# Patient Record
Sex: Male | Born: 1985 | Race: White | Hispanic: Yes | Smoking: Never smoker
Health system: Southern US, Community
[De-identification: ages and names within clinical notes are randomized; demographics above are authoritative.]

## PROBLEM LIST (undated history)

## (undated) DIAGNOSIS — Z789 Other specified health status: Secondary | ICD-10-CM

## (undated) HISTORY — PX: NO PAST SURGERIES: SHX2092

---

## 2007-04-13 ENCOUNTER — Encounter: Payer: Self-pay | Admitting: Emergency Medicine

## 2007-04-13 ENCOUNTER — Ambulatory Visit (HOSPITAL_COMMUNITY): Admission: RE | Admit: 2007-04-13 | Discharge: 2007-04-13 | Payer: Self-pay

## 2015-04-30 ENCOUNTER — Emergency Department (HOSPITAL_COMMUNITY)
Admission: EM | Admit: 2015-04-30 | Discharge: 2015-04-30 | Disposition: A | Discharge status: Home / Self Care | Payer: Self-pay | Attending: Emergency Medicine | Admitting: Emergency Medicine

## 2015-04-30 ENCOUNTER — Emergency Department (HOSPITAL_COMMUNITY): Admission: EM | Admit: 2015-04-30 | Discharge: 2015-04-30 | Discharge status: Absent without leave | Payer: Self-pay

## 2015-04-30 ENCOUNTER — Encounter (HOSPITAL_COMMUNITY): Payer: Self-pay | Admitting: *Deleted

## 2015-04-30 DIAGNOSIS — M5431 Sciatica, right side: Secondary | ICD-10-CM | POA: Insufficient documentation

## 2015-04-30 MED ORDER — DIAZEPAM 5 MG/ML IJ SOLN
5.0000 mg | Freq: Once | INTRAMUSCULAR | Status: AC
  Administered 2015-04-30: 5 mg via INTRAMUSCULAR
  Filled 2015-04-30: qty 2

## 2015-04-30 MED ORDER — PREDNISONE 20 MG PO TABS: 40.0000 mg | ORAL_TABLET | Freq: Every day | ORAL | Status: AC

## 2015-04-30 MED ORDER — KETOROLAC TROMETHAMINE 60 MG/2ML IM SOLN
60.0000 mg | Freq: Once | INTRAMUSCULAR | Status: AC
Start: 2015-04-30 — End: 2015-04-30
  Administered 2015-04-30: 60 mg via INTRAMUSCULAR
  Filled 2015-04-30: qty 2

## 2015-04-30 MED ORDER — HYDROCODONE-ACETAMINOPHEN 5-325 MG PO TABS: 2.0000 | ORAL_TABLET | ORAL | Status: DC | PRN

## 2015-04-30 MED ORDER — PREDNISONE 20 MG PO TABS
60.0000 mg | ORAL_TABLET | Freq: Once | ORAL | Status: AC
  Administered 2015-04-30: 60 mg via ORAL
  Filled 2015-04-30: qty 3

## 2015-04-30 MED ORDER — METHOCARBAMOL 500 MG PO TABS: 500.0000 mg | ORAL_TABLET | Freq: Two times a day (BID) | ORAL | Status: AC

## 2015-04-30 NOTE — ED Notes (Signed)
Pt states has been having lower back pain x1 month and been seen by a doctor and prescribed muscle relaxer. Pt states he bent over today and had sudden burning sensation that started in his right hip with radiation to right leg. Pt has no neuro deficits noted, pt able to bear weight on and ambulate. Pt denies any urinary or bladder incontinence. nad noted at this time.

## 2015-04-30 NOTE — Discharge Instructions (Signed)
Take prednisone as directed until gone. Take Vicodin as needed for pain. Take Robaxin as needed for muscle spasm. You may take all these medications together. Refer to attached documents for more information.

## 2015-04-30 NOTE — ED Provider Notes (Signed)
CSN: [id number]     Arrival date & time 04/30/15  1510 History  This chart was scribed for non-physician provider Emilia Beck, PA-C, working with Richardean Canal, MD by Phillis Haggis, ED Scribe. This patient was seen in room TR05C/TR05C and patient care was started at 4:22 PM.    Chief Complaint  Patient presents with  . Back Pain   Patient is a 29 y.o. male presenting with back pain. The history is provided by the spouse. The history is limited by a language barrier. No language interpreter was used.  Back Pain  HPI Comments: John Moss is a 29 y.o. male who presents to the Emergency Department complaining of lower back pain onset one month ago. Wife states that the patient bent over and felt sudden burning pain that radiated from his hip to his leg. Wife states that the patient says that the pain has improved slightly since arrival. Wife states that the patient says the pain starts from his butt and radiates down. She states that he was prescribed muscle relaxers Tuesday and that it does not help.   History reviewed. No pertinent past medical history. No past surgical history on file. History reviewed. No pertinent family history. History  Substance Use Topics  . Smoking status: Not on file  . Smokeless tobacco: Not on file  . Alcohol Use: Not on file    Review of Systems  Musculoskeletal: Positive for back pain and arthralgias.  All other systems reviewed and are negative.  Allergies  Review of patient's allergies indicates no known allergies.  Home Medications   Prior to Admission medications   Not on File   BP 112/64 mmHg  Pulse 69  Temp(Src) 97.7 F (36.5 C) (Oral)  Resp 20  Wt 157 lb (71.215 kg)  SpO2 96%   Physical Exam  Constitutional: He is oriented to person, place, and time. He appears well-developed and well-nourished. No distress.  HENT:  Head: Normocephalic and atraumatic.  Eyes: Conjunctivae and EOM are normal.  Neck: Normal range of motion.  Neck supple.  Cardiovascular: Normal rate, regular rhythm and normal heart sounds.   Pulmonary/Chest: Effort normal and breath sounds normal.  Abdominal: Soft. He exhibits no distension. There is no tenderness. There is no rebound.  Musculoskeletal: Normal range of motion. He exhibits no edema.  No midline spine tenderness to palpation. Right gluteal tenderness to palpation.   Neurological: He is alert and oriented to person, place, and time.  Extremity strength and sensation intact and equal bilaterally.  Skin: Skin is warm and dry.  Psychiatric: He has a normal mood and affect. His behavior is normal.  Nursing note and vitals reviewed.   ED Course  Procedures (including critical care time) DIAGNOSTIC STUDIES: Oxygen Saturation is 96% on room air, normal by my interpretation.    COORDINATION OF CARE: 4:23 PM-Discussed treatment plan which includes muscle relaxers, steroids, valium and toradol with pt at bedside and pt agreed to plan.   Labs Review Labs Reviewed - No data to display  Imaging Review No results found.   EKG Interpretation None      MDM   Final diagnoses:  Sciatica neuralgia, right    5:14 PM Patient has sciatica neuralgia on the right. No bladder/bowel incontinence or saddle paresthesias. Vitals stable and patient afebrile. Patient reports improvement after toradol and prednisone. Patient will have Vicodin, prednisone, and vicodin for pain.   I personally performed the services described in this documentation, which was scribed in my presence. The  recorded information has been reviewed and is accurate.    Emilia BeckKaitlyn Maha Fischel, PA-C 04/30/15 1715  Richardean Canalavid H Yao, MD 04/30/15 2329

## 2015-04-30 NOTE — ED Notes (Signed)
Pt reports lower back pain for a month. Pt bent over to pick something up then pt had a sudden onset of sharp lower back pain radiating down right leg

## 2015-05-05 ENCOUNTER — Emergency Department (HOSPITAL_COMMUNITY)
Admission: EM | Admit: 2015-05-05 | Discharge: 2015-05-05 | Disposition: A | Discharge status: Home / Self Care | Payer: Self-pay | Attending: Emergency Medicine | Admitting: Emergency Medicine

## 2015-05-05 ENCOUNTER — Encounter (HOSPITAL_COMMUNITY): Payer: Self-pay | Admitting: Emergency Medicine

## 2015-05-05 DIAGNOSIS — Z79899 Other long term (current) drug therapy: Secondary | ICD-10-CM | POA: Insufficient documentation

## 2015-05-05 DIAGNOSIS — Z792 Long term (current) use of antibiotics: Secondary | ICD-10-CM | POA: Insufficient documentation

## 2015-05-05 DIAGNOSIS — M5431 Sciatica, right side: Secondary | ICD-10-CM | POA: Insufficient documentation

## 2015-05-05 DIAGNOSIS — R200 Anesthesia of skin: Secondary | ICD-10-CM | POA: Insufficient documentation

## 2015-05-05 MED ORDER — HYDROMORPHONE HCL 1 MG/ML IJ SOLN
1.0000 mg | Freq: Once | INTRAMUSCULAR | Status: AC
  Administered 2015-05-05: 1 mg via INTRAMUSCULAR
  Filled 2015-05-05: qty 1

## 2015-05-05 MED ORDER — HYDROCODONE-ACETAMINOPHEN 5-325 MG PO TABS: ORAL_TABLET | ORAL | Status: AC

## 2015-05-05 MED ORDER — NAPROXEN 500 MG PO TABS: 500.0000 mg | ORAL_TABLET | Freq: Two times a day (BID) | ORAL | Status: AC

## 2015-05-05 MED ORDER — KETOROLAC TROMETHAMINE 60 MG/2ML IM SOLN
60.0000 mg | Freq: Once | INTRAMUSCULAR | Status: AC
  Administered 2015-05-05: 60 mg via INTRAMUSCULAR
  Filled 2015-05-05: qty 2

## 2015-05-05 NOTE — Discharge Instructions (Signed)
Please read and follow all provided instructions.  Your diagnoses today include:  1. Sciatica, right    Tests performed today include:  Vital signs - see below for your results today  Medications prescribed:   Vicodin (hydrocodone/acetaminophen) - narcotic pain medication  DO NOT drive or perform any activities that require you to be awake and alert because this medicine can make you drowsy. BE VERY CAREFUL not to take multiple medicines containing Tylenol (also called acetaminophen). Doing so can lead to an overdose which can damage your liver and cause liver failure and possibly death.   Naproxen - anti-inflammatory pain medication  Do not exceed 500mg  naproxen every 12 hours, take with food  You have been prescribed an anti-inflammatory medication or NSAID. Take with food. Take smallest effective dose for the shortest duration needed for your pain. Stop taking if you experience stomach pain or vomiting.   Take any prescribed medications only as directed.  Home care instructions:   Follow any educational materials contained in this packet  Please rest, use ice or heat on your back for the next several days  Do not lift, push, pull anything more than 10 pounds for the next week  Follow-up instructions: Please follow-up with your primary care provider in the next 3 days.   Return instructions:  SEEK IMMEDIATE MEDICAL ATTENTION IF YOU HAVE:  New numbness, tingling, weakness, or problem with the use of your arms or legs  Severe back pain not relieved with medications  Loss control of your bowels or bladder  Increasing pain in any areas of the body (such as chest or abdominal pain)  Shortness of breath, dizziness, or fainting.   Worsening nausea (feeling sick to your stomach), vomiting, fever, or sweats  Any other emergent concerns regarding your health   Additional Information:  Your vital signs today were: BP 114/71 mmHg   Pulse 70   Temp(Src) 97.7 F (36.5 C)  (Oral)   Resp 18   SpO2 100% If your blood pressure (BP) was elevated above 135/85 this visit, please have this repeated by your doctor within one month. --------------

## 2015-05-05 NOTE — ED Provider Notes (Signed)
CSN: [id number]     Arrival date & time 05/05/15  1204 History  This chart was scribed for Rhea BleacherJosh [place], PA-C working with Benjiman CoreNathan Pickering, MD by Elveria Risingimelie Horne, ED Scribe. This patient was seen in room TR11C/TR11C and the patient's care was started at 12:53 PM.   Chief Complaint  Patient presents with  . Back Pain  . Leg Pain   The history is provided by the patient. Language interpreter used: Friend accompanies patient to interpret.    HPI Comments: John Moss is a 29 y.o. male who presents to the Emergency Department complaining of continued lower back pain with radiation in to his right buttocks, extending down length of right leg, posteriorly. Patient evaluated 5/1 for the same symptoms; complaint of lower back pain with radiation into leg ongoing for one month. Patient diagnosed with right sciatica neuralgia and discharged with Vicodin, Robaxin and Prednisone. Patient reports mild improvement since his initial visit with treatment, but reports return of worsening pain this morning. Patient reports pain with movement, position, and applied pressure; patient is limping preferring not to bear weight on the leg. Patient reports numbness in his leg. Patient works in [employer]. Patient does not have primary care doctor. Patient denies warning symptoms of back pain including: fecal incontinence, urinary retention or overflow incontinence, night sweats, waking from sleep with back pain, unexplained fevers or weight loss, h/o cancer, IVDU, recent trauma.     History reviewed. No pertinent past medical history. History reviewed. No pertinent past surgical history. History reviewed. No pertinent family history. History  Substance Use Topics  . Smoking status: Not on file  . Smokeless tobacco: Not on file  . Alcohol Use: Not on file    Review of Systems  Constitutional: Negative for fever and unexpected weight change.  Gastrointestinal: Negative for nausea, vomiting, abdominal pain,  diarrhea and constipation.       Neg for fecal incontinence  Genitourinary: Negative for dysuria, hematuria, flank pain and difficulty urinating.       Negative for urinary incontinence or retention  Musculoskeletal: Positive for back pain.  Skin: Negative for rash.  Neurological: Positive for numbness. Negative for weakness.       Negative for saddle paresthesias       Allergies  Review of patient's allergies indicates no known allergies.  Home Medications   Prior to Admission medications   Medication Sig Start Date End Date Taking? Authorizing Provider  HYDROcodone-acetaminophen (NORCO/VICODIN) 5-325 MG per tablet Take 2 tablets by mouth every 4 (four) hours as needed. 04/30/15   Kaitlyn Szekalski, PA-C  methocarbamol (ROBAXIN) 500 MG tablet Take 1 tablet (500 mg total) by mouth 2 (two) times daily. 04/30/15   Kaitlyn Szekalski, PA-C  predniSONE (DELTASONE) 20 MG tablet Take 2 tablets (40 mg total) by mouth daily. Take 40 mg by mouth daily for 3 days, then 20mg  by mouth daily for 3 days, then 10mg  daily for 3 days 04/30/15   Emilia BeckKaitlyn Szekalski, PA-C   Triage Vitals: BP 114/71 mmHg  Pulse 70  Temp(Src) 97.7 F (36.5 C) (Oral)  Resp 18  SpO2 100% Physical Exam  Constitutional: He is oriented to person, place, and time. He appears well-developed and well-nourished. He appears distressed (in pain).  HENT:  Head: Normocephalic and atraumatic.  Eyes: Conjunctivae and EOM are normal.  Neck: Normal range of motion. Neck supple. No tracheal deviation present.  Cardiovascular: Normal rate.   Pulmonary/Chest: Effort normal. No respiratory distress.  Abdominal: Soft. There is no tenderness. There is  no CVA tenderness.  Musculoskeletal: Normal range of motion. He exhibits no tenderness.  No step-off noted with palpation of spine.   Neurological: He is alert and oriented to person, place, and time. He has normal strength and normal reflexes. A sensory deficit (posterior R thigh.) is present. He  exhibits normal muscle tone.  5/5 strength in entire lower extremities bilaterally. Patient ambulatory with slight limping but no foot drop.   Skin: Skin is warm and dry.  Psychiatric: He has a normal mood and affect. His behavior is normal.  Nursing note and vitals reviewed.   ED Course  Procedures (including critical care time)  COORDINATION OF CARE: 1:02 PM- Discussed treatment plan with patient at bedside and patient agreed to plan.   Labs Review Labs Reviewed - No data to display  Imaging Review No results found.   EKG Interpretation None      Patient seen and examined. Medications ordered. IM dilaudid for pain. Discussed need for close PCP follow-up and evaluation. Referral given.  Vital signs reviewed and are as follows: Filed Vitals:   05/05/15 1331  BP: 119/67  Pulse: 66  Temp: 98.1 F (36.7 C)  Resp: 16   No red flag s/s of low back pain. Patient was counseled on back pain precautions and told to do activity as tolerated but do not lift, push, or pull heavy objects more than 10 pounds for the next week.  Patient counseled to use ice or heat on back for no longer than 15 minutes every hour.   Patient prescribed narcotic pain medicine and counseled on proper use of narcotic pain medications. Counseled not to combine this medication with others containing tylenol.   Urged patient not to drink alcohol, drive, or perform any other activities that requires focus while taking either of these medications.  Patient urged to follow-up with PCP if pain does not improve with treatment and rest or if pain becomes recurrent. Urged to return with worsening severe pain, loss of bowel or bladder control, trouble walking.   The patient verbalizes understanding and agrees with the plan.     MDM   Final diagnoses:  Sciatica, right   Patient with back pain with radicular features. Patient is ambulatory. No warning symptoms of back pain including: fecal incontinence, urinary  retention or overflow incontinence, night sweats, waking from sleep with back pain, unexplained fevers or weight loss, h/o cancer, IVDU, recent trauma. No concern for cauda equina, epidural abscess, or other serious cause of back pain. Conservative measures such as rest, ice/heat and pain medicine indicated with PCP follow-up if no improvement with conservative management.    I personally performed the services described in this documentation, which was scribed in my presence. The recorded information has been reviewed and is accurate.    Renne CriglerJoshua Irasema Chalk, PA-C 05/05/15 1439  Benjiman CoreNathan Pickering, MD 05/05/15 [phone]

## 2015-05-05 NOTE — ED Notes (Signed)
States right leg feels "asleep"== it doesn't do what I want it to"

## 2015-05-05 NOTE — ED Notes (Signed)
Pt sts lower back pain with radiation to right leg; seen here for same on Sunday

## 2015-05-31 ENCOUNTER — Ambulatory Visit: Payer: Self-pay | Attending: Internal Medicine

## 2015-08-25 ENCOUNTER — Emergency Department (INDEPENDENT_AMBULATORY_CARE_PROVIDER_SITE_OTHER)
Admission: EM | Admit: 2015-08-25 | Discharge: 2015-08-25 | Disposition: A | Discharge status: Home / Self Care | Payer: Self-pay | Source: Home / Self Care | Attending: Family Medicine | Admitting: Family Medicine

## 2015-08-25 ENCOUNTER — Encounter (HOSPITAL_COMMUNITY): Payer: Self-pay | Admitting: Emergency Medicine

## 2015-08-25 DIAGNOSIS — M5416 Radiculopathy, lumbar region: Secondary | ICD-10-CM

## 2015-08-25 MED ORDER — TRAMADOL HCL 50 MG PO TABS: 50.0000 mg | ORAL_TABLET | Freq: Two times a day (BID) | ORAL | Status: DC | PRN

## 2015-08-25 MED ORDER — PREDNISONE 10 MG (48) PO TBPK: ORAL_TABLET | Freq: Every day | ORAL | Status: DC

## 2015-08-25 NOTE — ED Notes (Signed)
C/o right leg pain onset May... Last 3 days have been getting worse Denies inj/trauma Sx include numbness and tingly Saw a chiropractor yest... Has also been to [facility] ER back in may for similar sx Steady gait... No acute distress.

## 2015-08-25 NOTE — ED Provider Notes (Signed)
CSN: [id number]     Arrival date & time 08/25/15  1307 History   First MD Initiated Contact with Patient 08/25/15 1359     Chief Complaint  Patient presents with  . Leg Pain   (Consider location/radiation/quality/duration/timing/severity/associated sxs/prior Treatment) HPI Comments: Patient presents with right leg pain and periodic tingling that began in May. No injury.  He carries a history of "pinched nerve" in his lower back. He has been going to a chiropractor and this has helped. In the last few days the pain has worsened. He denies weakness. No loss of bowel of bladder control is noted.    Patient is a 29 y.o. male presenting with leg pain. The history is provided by the patient. The history is limited by a language barrier. A language interpreter was used.  Leg Pain   History reviewed. No pertinent past medical history. History reviewed. No pertinent past surgical history. No family history on file. Social History  Substance Use Topics  . Smoking status: Never Smoker   . Smokeless tobacco: None  . Alcohol Use: No    Review of Systems  All other systems reviewed and are negative.   Allergies  Review of patient's allergies indicates no known allergies.  Home Medications   Prior to Admission medications   Medication Sig Start Date End Date Taking? Authorizing Provider  predniSONE (STERAPRED UNI-PAK 48 TAB) 10 MG (48) TBPK tablet Take by mouth daily. 08/25/15   Riki Sheer, PA-C  traMADol (ULTRAM) 50 MG tablet Take 1 tablet (50 mg total) by mouth every 12 (twelve) hours as needed for moderate pain. 08/25/15   Riki Sheer, PA-C   Meds Ordered and Administered this Visit  Medications - No data to display  BP 116/73 mmHg  Pulse 53  Temp(Src) 97.8 F (36.6 C) (Oral)  Resp 16  SpO2 100% No data found.   Physical Exam  Constitutional: He is oriented to person, place, and time. He appears well-developed and well-nourished. No distress.  Pulmonary/Chest: Effort  normal.  Musculoskeletal: He exhibits no edema or tenderness.  Limited ROM of the lumbar spine; worsened pain with extension. Positive SLR on the right  Neurological: He is alert and oriented to person, place, and time. He displays normal reflexes. He exhibits normal muscle tone. Coordination normal.  Skin: Skin is warm and dry. He is not diaphoretic.  Psychiatric: His behavior is normal.  Nursing note and vitals reviewed.   ED Course  Procedures (including critical care time)  Labs Review Labs Reviewed - No data to display  Imaging Review No results found.   Visual Acuity Review  Right Eye Distance:   Left Eye Distance:   Bilateral Distance:    Right Eye Near:   Left Eye Near:    Bilateral Near:         MDM   1. Pain, radicular, lumbar    Agree with radicular leg pain without neuro deficit at this time. Ultimately he needs an orthopedic evaluation and MRI given the extended duration of pain. He is given info for an appt. In the interim treat with a Prednisone pack and prn tramadol. Should he develop weakness, loss of bowel of bladder control present to ER emergently.     Riki Sheer, PA-C 08/25/15 1438

## 2015-08-25 NOTE — Discharge Instructions (Signed)
Radiculopatía lumbosacra °(Lumbosacral Radiculopathy) °Le han diagnosticado una radiculopatía lumbosacra. Significa que un nervio está comprimido en la zona inferior de la espalda (área lumbosacra). Cuando esto ocurre, puede sentir debilidad en las piernas y ser incapaz de mantenerse parado en puntas de pie. Puede sentir un dolor que le corre por las piernas hacia abajo. Puede tener dificultad para caminar normalmente. Hay numerosas causas que originan este problema. Algunas veces es consecuencia de una lesión o simplemente es debido a artritis o problemas óseos. Otra de las causas puede ser una enfermedad, como la diabetes. Si no mejora luego del tratamiento, deberá realizarse estudios más profundos para hallar la causa exacta. °DIAGNÓSTICO °Puede ser necesario que le tomen radiografías si los problemas persisten durante mucho tiempo. Podrán realizarle una electromiografía. En este examen se estudia el funcionamiento de los nervios y los músculos. °INSTRUCCIONES PARA EL CUIDADO DOMICILIARIO °· Puede aliviarlo la aplicación de una bolsa con hielo. Coloque el hielo en una bolsa plástica y envuélvala en una toalla para prevenir el congelamiento de la piel. Podrá aplicarlo cada 2 horas durante 20 ó 30 minutos, o cuando lo crea necesario, mientras se encuentre despierto, o según se lo haya indicado el profesional que lo asiste. °· Si luego de aplicar calor y frío observa que uno le hace mejor que el otro, continúe con el que le trae mayor alivio. °· Utilice los medicamentos de venta libre o de prescripción para el dolor, el malestar o la fiebre, según se lo indique el profesional que lo asiste. °· Si le indican fisioterapia, siga las indicaciones de su médico. °SOLICITE ATENCIÓN MÉDICA DE INMEDIATO SI: °· El dolor no se alivia con los medicamentos. °· Parece empeorar más que mejorar. °· Aumenta la debilidad en las piernas. °· Pierde el control de la vejiga o del intestino. °· Tiene dificultad para caminar o para  mantener el equilibrio o torpeza en el uso de las piernas. °· Tiene fiebre. °ESTÉ SEGURO QUE:  °· Comprende las instrucciones para el alta médica. °· Controlará su enfermedad. °· Solicitará atención médica de inmediato según las indicaciones. °Document Released: 09/25/2005 Document Revised: 03/09/2012 °ExitCare® Patient Information ©2015 [employer]. This information is not intended to replace advice given to you by your health care provider. Make sure you discuss any questions you have with your health care provider. ° °

## 2015-08-30 ENCOUNTER — Encounter: Payer: Self-pay | Admitting: Internal Medicine

## 2015-08-30 ENCOUNTER — Ambulatory Visit: Payer: Self-pay | Attending: Internal Medicine | Admitting: Internal Medicine

## 2015-08-30 ENCOUNTER — Telehealth: Payer: Self-pay | Admitting: Internal Medicine

## 2015-08-30 VITALS — BP 156/77 | HR 73 | Temp 98.0°F | Resp 16 | Wt 162.4 lb

## 2015-08-30 DIAGNOSIS — M79604 Pain in right leg: Secondary | ICD-10-CM | POA: Insufficient documentation

## 2015-08-30 DIAGNOSIS — M5410 Radiculopathy, site unspecified: Secondary | ICD-10-CM

## 2015-08-30 DIAGNOSIS — M5431 Sciatica, right side: Secondary | ICD-10-CM | POA: Insufficient documentation

## 2015-08-30 DIAGNOSIS — Z79899 Other long term (current) drug therapy: Secondary | ICD-10-CM | POA: Insufficient documentation

## 2015-08-30 DIAGNOSIS — Z0000 Encounter for general adult medical examination without abnormal findings: Secondary | ICD-10-CM

## 2015-08-30 MED ORDER — TRAMADOL HCL 50 MG PO TABS: 50.0000 mg | ORAL_TABLET | Freq: Two times a day (BID) | ORAL | Status: DC | PRN

## 2015-08-30 NOTE — Progress Notes (Signed)
Patient ID: John Moss, male   DOB: 02/03/1986, 29 y.o.   MRN: [id number]  WGN:[id number]  HQI:[id number]  DOB - [birth year]  CC:  Chief Complaint  Patient presents with  . New Patient (Initial Visit)       HPI: John Moss is a 29 y.o. male here today to establish medical care. Patient has no past medical history. He reports that he went to the Urgent Care on 8/26 for right leg pain. He reports that he has had a lumbar spine xray that was negative but I cannot find any records of that in the EMR.  He was told that the pain is radicular and was given Tramadol and prednisone all of which he is currently taking with mild relief. He states that he has been told in the past that he has a pinched nerve ("3 disc pressing on nerve") in his lower back and has been going to see a chiropractor who is currently doing exercises with him. Last chiropractor visit was last week.  He also reports that he went to another clinic Friday that sent him to [facility] who states that his disc were fine and they thought the pain was musculoskeletal and gave him Robaxin to take. Patient reports that pain is located in his right buttock and radiates down his posterior right leg. The pain is sharp and shooting. He denies bowel or bladder dysfunction.  No Known Allergies History reviewed. No pertinent past medical history. Current Outpatient Prescriptions on File Prior to Visit  Medication Sig Dispense Refill  . predniSONE (STERAPRED UNI-PAK 48 TAB) 10 MG (48) TBPK tablet Take by mouth daily. 48 tablet 0  . traMADol (ULTRAM) 50 MG tablet Take 1 tablet (50 mg total) by mouth every 12 (twelve) hours as needed for moderate pain. 15 tablet 0   No current facility-administered medications on file prior to visit.   History reviewed. No pertinent family history. Social History   Social History  . Marital Status: Married    Spouse Name: N/A  . Number of Children: N/A  . Years of Education: N/A    Occupational History  . Not on file.   Social History Main Topics  . Smoking status: Never Smoker   . Smokeless tobacco: Not on file  . Alcohol Use: No  . Drug Use: No  . Sexual Activity: Not on file   Other Topics Concern  . Not on file   Social History Narrative    Review of Systems: Other than what is stated in HPI, all other systems are negative.   Objective:   Filed Vitals:   08/30/15 1118  BP: 156/77  Pulse: 73  Temp: 98 F (36.7 C)  Resp: 16    Physical Exam  Constitutional: He is oriented to person, place, and time.  Cardiovascular: Normal rate, regular rhythm and normal heart sounds.   Pulmonary/Chest: Effort normal and breath sounds normal.  Musculoskeletal: He exhibits no tenderness.  Positive right straight leg raise   Neurological: He is alert and oriented to person, place, and time. He has normal reflexes.  Psychiatric: He has a normal mood and affect.    No results found for: WBC, HGB, HCT, MCV, PLT No results found for: CREATININE, BUN, NA, K, CL, CO2  No results found for: HGBA1C Lipid Panel  No results found for: CHOL, TRIG, HDL, CHOLHDL, VLDL, LDLCALC     Assessment and plan:   John Moss was seen today for new patient (initial visit).  Diagnoses and all orders  for this visit:  Radicular leg pain -     Ambulatory referral to Orthopedic Surgery -     Refill traMADol (ULTRAM) 50 MG tablet; Take 1 tablet (50 mg total) by mouth every 12 (twelve) hours as needed for moderate pain. I have explained to patient to continue all current medications and if no improvement in 2 weeks he can call and reschedule appointment with [facility].   Sciatica, right See above Explained signs and symptoms that should warrant immediate attention.  Patient verbalized understanding with teach back used.   Interpreter was used to communicate directly with patient for the entire encounter including providing detailed patient instructions.   Return  if symptoms worsen or fail to improve.   Ambrose Finland, NP-C [facility] [phone] 08/30/2015, 11:23 AM

## 2015-08-30 NOTE — Telephone Encounter (Signed)
Patient forgot to mention that he would like a dental referral, please f/u

## 2015-08-30 NOTE — Progress Notes (Signed)
Patient complains of having pain to the lower right Side of his buttocks Patient states he was seen in urgent care on Monday and x ray was negative Patient currently taking prednisone tramadol and methocarbanol Interpreter line was used [employer] [id number]

## 2015-09-12 ENCOUNTER — Other Ambulatory Visit (HOSPITAL_COMMUNITY): Payer: Self-pay | Admitting: Physician Assistant

## 2015-09-12 DIAGNOSIS — M545 Low back pain: Secondary | ICD-10-CM

## 2015-09-18 ENCOUNTER — Emergency Department (INDEPENDENT_AMBULATORY_CARE_PROVIDER_SITE_OTHER)
Admission: EM | Admit: 2015-09-18 | Discharge: 2015-09-18 | Disposition: A | Discharge status: Nursing Home (Includes ICF) | Payer: Self-pay | Source: Home / Self Care | Attending: Family Medicine | Admitting: Family Medicine

## 2015-09-18 ENCOUNTER — Emergency Department (HOSPITAL_COMMUNITY)
Admission: EM | Admit: 2015-09-18 | Discharge: 2015-09-19 | Disposition: A | Discharge status: Home / Self Care | Payer: Self-pay | Attending: Emergency Medicine | Admitting: Emergency Medicine

## 2015-09-18 ENCOUNTER — Emergency Department (HOSPITAL_COMMUNITY): Payer: Self-pay

## 2015-09-18 ENCOUNTER — Encounter (HOSPITAL_COMMUNITY): Payer: Self-pay | Admitting: Emergency Medicine

## 2015-09-18 ENCOUNTER — Encounter (HOSPITAL_COMMUNITY): Payer: Self-pay

## 2015-09-18 DIAGNOSIS — R109 Unspecified abdominal pain: Secondary | ICD-10-CM | POA: Insufficient documentation

## 2015-09-18 DIAGNOSIS — M5441 Lumbago with sciatica, right side: Secondary | ICD-10-CM | POA: Insufficient documentation

## 2015-09-18 DIAGNOSIS — Z7982 Long term (current) use of aspirin: Secondary | ICD-10-CM | POA: Insufficient documentation

## 2015-09-18 LAB — CBC WITH DIFFERENTIAL/PLATELET
BASOS ABS: 0 10*3/uL (ref 0.0–0.1)
Basophils Relative: 0 %
EOS ABS: 0 10*3/uL (ref 0.0–0.7)
EOS PCT: 0 %
HCT: 46.1 % (ref 39.0–52.0)
Hemoglobin: 16.1 g/dL (ref 13.0–17.0)
LYMPHS PCT: 6 %
Lymphs Abs: 0.7 10*3/uL (ref 0.7–4.0)
MCH: 28.8 pg (ref 26.0–34.0)
MCHC: 34.9 g/dL (ref 30.0–36.0)
MCV: 82.3 fL (ref 78.0–100.0)
MONO ABS: 0.1 10*3/uL (ref 0.1–1.0)
Monocytes Relative: 1 %
Neutro Abs: 10.8 10*3/uL — ABNORMAL HIGH (ref 1.7–7.7)
Neutrophils Relative %: 93 %
PLATELETS: 330 10*3/uL (ref 150–400)
RBC: 5.6 MIL/uL (ref 4.22–5.81)
RDW: 12.8 % (ref 11.5–15.5)
WBC: 11.6 10*3/uL — AB (ref 4.0–10.5)

## 2015-09-18 LAB — BASIC METABOLIC PANEL
ANION GAP: 12 (ref 5–15)
BUN: 7 mg/dL (ref 6–20)
CALCIUM: 9.6 mg/dL (ref 8.9–10.3)
CO2: 24 mmol/L (ref 22–32)
CREATININE: 0.66 mg/dL (ref 0.61–1.24)
Chloride: 102 mmol/L (ref 101–111)
GFR calc Af Amer: >60 mL/min (ref >60)
GLUCOSE: 111 mg/dL — AB (ref 65–99)
Potassium: 3.9 mmol/L (ref 3.5–5.1)
Sodium: 138 mmol/L (ref 135–145)

## 2015-09-18 LAB — URINALYSIS, ROUTINE W REFLEX MICROSCOPIC
BILIRUBIN URINE: NEGATIVE
GLUCOSE, UA: NEGATIVE mg/dL
Hgb urine dipstick: NEGATIVE
KETONES UR: 15 mg/dL — AB
LEUKOCYTES UA: NEGATIVE
NITRITE: NEGATIVE
PH: 6 (ref 5.0–8.0)
PROTEIN: NEGATIVE mg/dL
Specific Gravity, Urine: 1.011 (ref 1.005–1.030)
Urobilinogen, UA: 0.2 mg/dL (ref 0.0–1.0)

## 2015-09-18 MED ORDER — IBUPROFEN 800 MG PO TABS
800.0000 mg | ORAL_TABLET | Freq: Once | ORAL | Status: AC
  Administered 2015-09-18: 800 mg via ORAL
  Filled 2015-09-18: qty 1

## 2015-09-18 MED ORDER — METHYLPREDNISOLONE SODIUM SUCC 125 MG IJ SOLR
125.0000 mg | Freq: Once | INTRAMUSCULAR | Status: AC
  Administered 2015-09-18: 125 mg via INTRAMUSCULAR

## 2015-09-18 MED ORDER — ONDANSETRON 4 MG PO TBDP
8.0000 mg | ORAL_TABLET | Freq: Once | ORAL | Status: AC
  Administered 2015-09-18: 8 mg via ORAL

## 2015-09-18 MED ORDER — METHYLPREDNISOLONE SODIUM SUCC 125 MG IJ SOLR
INTRAMUSCULAR | Status: AC
  Filled 2015-09-18: qty 2

## 2015-09-18 MED ORDER — HYDROMORPHONE HCL 1 MG/ML IJ SOLN
1.0000 mg | Freq: Once | INTRAMUSCULAR | Status: AC
  Administered 2015-09-18: 1 mg via INTRAVENOUS
  Filled 2015-09-18: qty 1

## 2015-09-18 MED ORDER — HYDROMORPHONE HCL 1 MG/ML IJ SOLN
INTRAMUSCULAR | Status: AC
  Filled 2015-09-18: qty 1

## 2015-09-18 MED ORDER — IOHEXOL 300 MG/ML  SOLN
25.0000 mL | Freq: Once | INTRAMUSCULAR | Status: AC | PRN
  Administered 2015-09-18: 25 mL via ORAL

## 2015-09-18 MED ORDER — PREDNISONE 20 MG PO TABS
60.0000 mg | ORAL_TABLET | Freq: Once | ORAL | Status: AC
  Administered 2015-09-18: 60 mg via ORAL
  Filled 2015-09-18: qty 3

## 2015-09-18 MED ORDER — IOHEXOL 300 MG/ML  SOLN
100.0000 mL | Freq: Once | INTRAMUSCULAR | Status: AC | PRN
  Administered 2015-09-18: 100 mL via INTRAVENOUS

## 2015-09-18 MED ORDER — HYDROMORPHONE HCL 1 MG/ML IJ SOLN
1.0000 mg | Freq: Once | INTRAMUSCULAR | Status: AC
  Administered 2015-09-18: 1 mg via INTRAMUSCULAR

## 2015-09-18 MED ORDER — ONDANSETRON 4 MG PO TBDP
ORAL_TABLET | ORAL | Status: AC
  Filled 2015-09-18: qty 2

## 2015-09-18 NOTE — ED Notes (Signed)
Ct called as patient has finished drinking contrast.

## 2015-09-18 NOTE — ED Notes (Signed)
Assisted patient to the restroom, displayed poor ambulation and required assistance. Returned to room via whleelchair, called Dr. Alycia Rossetti to discuss ambulation and to request pain medication. MD acknowledges, awaiting new orders.

## 2015-09-18 NOTE — ED Notes (Signed)
Patient is currenlty consuming contrast.

## 2015-09-18 NOTE — ED Notes (Signed)
Left contact number with visitor while they step out.

## 2015-09-18 NOTE — ED Notes (Signed)
Back pain, particularly right buttocks and down leg.  Difficulty urinating.  No known injury.  Onset in may, and since onset has been seen at [facility], [facility], chiropractor, [facility], and by a specialist.  Specialist has scheduled patient for mri on Wednesday, but unable to wait until then.  Pain is unbearable

## 2015-09-18 NOTE — ED Provider Notes (Signed)
CSN: [id number]     Arrival date & time 09/18/15  1747 History   First MD Initiated Contact with Patient 09/18/15 1753     Chief Complaint  Patient presents with  . Back Pain   Patient is a 29 y.o. male presenting with general illness. The history is provided by the patient. The history is limited by a language barrier. No language interpreter was used.  Illness Location:  Back, right leg Quality:  Pain Severity:  Moderate Onset quality:  Gradual Timing:  Intermittent Progression:  Waxing and waning Chronicity: Acute on chronic. Context:  PMHx presenting with lower back pain. Onset 5 months ago. No known inciting injury or event. Persistent with intermittent worsening. Previous treatments including IV corticosteroid injections, muscle relaxants, tramadol, and aspirin with intermittent relief.  Associated symptoms: no abdominal pain, no chest pain, no cough, no diarrhea, no fever, no headaches, no nausea, no shortness of breath and no vomiting     History reviewed. No pertinent past medical history. History reviewed. No pertinent past surgical history. No family history on file. Social History  Substance Use Topics  . Smoking status: Never Smoker   . Smokeless tobacco: None  . Alcohol Use: No    Review of Systems  Constitutional: Negative for fever and chills.  Respiratory: Negative for cough and shortness of breath.   Cardiovascular: Negative for chest pain.  Gastrointestinal: Negative for nausea, vomiting, abdominal pain and diarrhea.  Musculoskeletal: Positive for back pain and arthralgias. Negative for joint swelling.  Neurological: Negative for dizziness, tremors, seizures, syncope, facial asymmetry, speech difficulty, weakness, light-headedness, numbness and headaches.  All other systems reviewed and are negative.   Allergies  Review of patient's allergies indicates no known allergies.  Home Medications   Prior to Admission medications   Medication Sig Start Date End  Date Taking? Authorizing Provider  aspirin EC 325 MG tablet Take 2,600 mg by mouth once.   Yes Historical Provider, MD  cyclobenzaprine (FLEXERIL) 10 MG tablet Take 1 tablet (10 mg total) by mouth daily as needed for muscle spasms. 09/19/15   Angelina Ok, MD  ibuprofen (ADVIL,MOTRIN) 800 MG tablet Take 1 tablet (800 mg total) by mouth every 8 (eight) hours as needed. 09/19/15   Angelina Ok, MD  predniSONE (DELTASONE) 20 MG tablet Take 2 tablets (40 mg total) by mouth once. 09/19/15   Angelina Ok, MD   BP 105/61 mmHg  Pulse 58  Temp(Src) 98.3 F (36.8 C) (Oral)  Resp 16  SpO2 96%   Physical Exam  Constitutional: He appears well-developed and well-nourished. No distress.  HENT:  Head: Normocephalic and atraumatic.  Eyes: Conjunctivae are normal. Pupils are equal, round, and reactive to light.  Neck: Normal range of motion. Neck supple.  Cardiovascular: Normal rate, regular rhythm and intact distal pulses.   Pulmonary/Chest: Effort normal and breath sounds normal. No respiratory distress. He has no wheezes.  Abdominal: Soft. Bowel sounds are normal. He exhibits no distension. There is no rebound and no guarding.  Abdomen with mild tenderness to palpation of inguinal crease without any obvious hernia.  Musculoskeletal: Normal range of motion. He exhibits tenderness. He exhibits no edema.  No midline tenderness to C/T/L-spine. There is palpation of right lumbar paraspinal muscles as well as right gluteal muscles. Positive straight leg raise test on right.  Neurological: He is alert. He has normal reflexes. He displays normal reflexes. No cranial nerve deficit. He exhibits normal muscle tone. Coordination normal.  Skin: Skin is warm and dry. He is  not diaphoretic.    ED Course  Procedures   Labs Review Labs Reviewed  CBC WITH DIFFERENTIAL/PLATELET - Abnormal; Notable for the following:    WBC 11.6 (*)    Neutro Abs 10.8 (*)    All other components within normal limits   URINALYSIS, ROUTINE W REFLEX MICROSCOPIC (NOT AT [facility]) - Abnormal; Notable for the following:    Ketones, ur 15 (*)    All other components within normal limits  BASIC METABOLIC PANEL - Abnormal; Notable for the following:    Glucose, Bld 111 (*)    All other components within normal limits   Imaging Review Ct Abdomen Pelvis W Contrast  09/19/2015   CLINICAL DATA:  Back and RIGHT buttocks pain extending down RIGHT leg, no known injury, RIGHT lower back and RIGHT lower quadrant pain, tenderness palpation at RIGHT inguinal crease and RIGHT buttock  EXAM: CT ABDOMEN AND PELVIS WITH CONTRAST  TECHNIQUE: Multidetector CT imaging of the abdomen and pelvis was performed using the standard protocol following bolus administration of intravenous contrast. Sagittal and coronal MPR images reconstructed from axial data set.  CONTRAST:  OMNIPAQUE IOHEXOL 300 MG/ML SOLN IV. Dilute oral contrast.  COMPARISON:  None  FINDINGS: Bibasilar atelectasis.  Liver, spleen, pancreas, kidneys, and adrenal glands normal appearance.  Unremarkable gallbladder and appendix by CT.  Mid bladder and ureters normal appearance.  Redundant sigmoid colon.  Bowel loops otherwise normal.  Few respiratory motion artifacts in RIGHT lower quadrant.  No mass, adenopathy, free air or free fluid.  No evidence of hernia.  Osseous structures unremarkable.  IMPRESSION: No acute intra-abdominal or intrapelvic abnormalities.   Electronically Signed   By: Ulyses Southward M.D.   On: 09/19/2015 00:27   I have personally reviewed and evaluated these images and lab results as part of my medical decision-making.   EKG Interpretation None      MDM  Mr. Vitali is a 29 yo male w/ no PMHx presenting with lower back pain. Onset 5 months ago. No known inciting injury or event. Persistent with intermittent worsening. Previous treatments including IV corticosteroid injections, muscle relaxants, tramadol, and aspirin with intermittent relief.   Negative  for Hx of known CA, night sweats, sudden unexpected weight loss, fever - CA unlikely. Negative for chronic steroid use or osteoporosis or significant trauma - acute fracture unlikely. Negative for abdominal bruit or palpable pulsating mass, age <50 & non-smoker w/o Hx of HLD, vitals are WNL, pulses & BP equal bilaterally - AAA/dissection unlikely. Negative for persistent fever, no Hx if IVDU or previous back injections or DM/immunodeficieny - abscess or discitis unlikely. Negative for saddle anesthesia or bowel/bladder incontinence, rectal tone normal, neuro exam reveals no localized deficits - cauda equina unlikely. Pt did participate in bending over recently which could account for muscle strain, inflammation, subsequent increase in discomfort.  Exam above notable for young male lying in stretcher in no acute distress. Afebrile. Heart rate 60's to 70's. Normotensive. Not tachypneic. Breathing well on room air maintaining saturations. No midline tenderness to C/T/L-spine. There is TTP of right lumbar paraspinal muscles as well as right gluteal muscles. Positive straight leg raise test on right. Neuro exam nonfocal. Abdomen with mild TTP of inguinal crease without any obvious hernia.  UA clear and showing no evidence of blood or infection or severe dehydration. WBC 11.6. Hemoglobin 16.1. BMP completely unremarkable. CT abdomen pelvis with contrast showing no acute fracture or malalignment of the spine, no acute central cord stenosis or foraminal narrowing or  any other evidence of acute intra-abdominal process.  Patient currently has chronic low back pain of unclear etiology but likely sciatica from muscle spasm given above. Patient advised to continue conservative treatment with OTC Tylenol, OTC Advil, and warm compresses. They may pursue gentle exercise/stretching regimen as tolerated. Patient given educational materials about chronic back pain as well as back exercises to attempt at home.  Pt discharged  home in stable condition. Provided patient with a prescription for Flexeril, prednisone burst, and ibuprofen - no indication for initiating pain contract for opioid analgesics at this time. Patient has MRI ordered in x2 days from now. Strict ED return precautions dicussed. Pt understands and agrees with the plan and has no further questions or concerns.   Pt care discussed with and followed by my attending, Dr. Greggory Keen, MD Pager [phone]   Final diagnoses:  Right-sided low back pain with right-sided sciatica    Angelina Ok, MD 09/19/15 5621  Gilda Crease, MD 09/19/15 [phone]

## 2015-09-18 NOTE — ED Provider Notes (Signed)
CSN: [id number]     Arrival date & time 09/18/15  1446 History   First MD Initiated Contact with Patient 09/18/15 1619     Chief Complaint  Patient presents with  . Back Pain   (Consider location/radiation/quality/duration/timing/severity/associated sxs/prior Treatment) Patient is a 29 y.o. male presenting with back pain. The history is provided by the patient. The history is limited by a language barrier. A language interpreter was used.  Back Pain Location:  Lumbar spine Quality:  Stabbing and shooting Radiates to:  R posterior upper leg, R knee and R foot Pain severity:  Moderate Onset quality:  Gradual Duration:  5 months Timing:  Constant Progression:  Worsening Chronicity:  Chronic Worsened by:  Bending, movement and standing Associated symptoms: leg pain, numbness, paresthesias and weakness     History reviewed. No pertinent past medical history. History reviewed. No pertinent past surgical history. No family history on file. Social History  Substance Use Topics  . Smoking status: Never Smoker   . Smokeless tobacco: None  . Alcohol Use: No    Review of Systems  Gastrointestinal: Negative.   Genitourinary: Negative.   Musculoskeletal: Positive for back pain and gait problem.  Skin: Negative.   Neurological: Positive for weakness, numbness and paresthesias.    Allergies  Review of patient's allergies indicates no known allergies.  Home Medications   Prior to Admission medications   Medication Sig Start Date End Date Taking? Authorizing Provider  methocarbamol (ROBAXIN) 500 MG tablet Take 500 mg by mouth 3 (three) times daily as needed for muscle spasms.    Historical Provider, MD  predniSONE (STERAPRED UNI-PAK 48 TAB) 10 MG (48) TBPK tablet Take by mouth daily. 08/25/15   Riki Sheer, PA-C  traMADol (ULTRAM) 50 MG tablet Take 1 tablet (50 mg total) by mouth every 12 (twelve) hours as needed for moderate pain. 08/30/15   Ambrose Finland, NP   Meds Ordered and  Administered this Visit   Medications  methylPREDNISolone sodium succinate (SOLU-MEDROL) 125 mg/2 mL injection 125 mg (not administered)  HYDROmorphone (DILAUDID) injection 1 mg (not administered)  ondansetron (ZOFRAN-ODT) disintegrating tablet 8 mg (not administered)    BP 115/66 mmHg  Pulse 67  Temp(Src) 98 F (36.7 C) (Oral)  Resp 16  SpO2 100% No data found.   Physical Exam  Constitutional: He is oriented to person, place, and time. He appears well-developed and well-nourished. He appears distressed.  Musculoskeletal: He exhibits tenderness.       Lumbar back: He exhibits decreased range of motion, tenderness, bony tenderness, pain and spasm. He exhibits normal pulse.       Back:  Neurological: He is alert and oriented to person, place, and time.  Skin: Skin is warm and dry.  Nursing note and vitals reviewed.   ED Course  Procedures (including critical care time)  Labs Review Labs Reviewed - No data to display  Imaging Review No results found.   Visual Acuity Review  Right Eye Distance:   Left Eye Distance:   Bilateral Distance:    Right Eye Near:   Left Eye Near:    Bilateral Near:         MDM   1. Right-sided low back pain with right-sided sciatica     Sent for MRI eval of unrelenting pain right leg to foot.  Linna Hoff, MD 09/18/15 [phone]

## 2015-09-18 NOTE — ED Notes (Signed)
Called CT again, spoke to [facility], patient still waiting for transport. They acknowledge.

## 2015-09-18 NOTE — ED Notes (Signed)
Pt. Presents with complaint of back pain. Pt. Has been seen recurrently for same back/leg pain. Seen at [facility] today, tx for MRI. Pt. Given 1 mg of dilaudid, 125 mg solumedrol, and 4 mg zofran prior to arrival. Pt. Has no neuro deficits, states pain only with movement. Pain 1/10 now. Pt. Sleeping on assessment, arousable to voice.

## 2015-09-18 NOTE — ED Notes (Signed)
Notified [facility] 

## 2015-09-19 MED ORDER — OXYCODONE-ACETAMINOPHEN 5-325 MG PO TABS
1.0000 | ORAL_TABLET | Freq: Once | ORAL | Status: AC
  Administered 2015-09-19: 1 via ORAL
  Filled 2015-09-19: qty 1

## 2015-09-19 MED ORDER — CYCLOBENZAPRINE HCL 10 MG PO TABS: 10.0000 mg | ORAL_TABLET | Freq: Every day | ORAL | Status: DC | PRN

## 2015-09-19 MED ORDER — PREDNISONE 20 MG PO TABS: 40.0000 mg | ORAL_TABLET | Freq: Once | ORAL | Status: DC

## 2015-09-19 MED ORDER — IBUPROFEN 800 MG PO TABS: 800.0000 mg | ORAL_TABLET | Freq: Three times a day (TID) | ORAL | Status: DC | PRN

## 2015-09-19 NOTE — ED Notes (Signed)
Called radiology and Dr. Alycia Rossetti in regards to CT results. Awaiting report. Relayed to patient and family.

## 2015-09-19 NOTE — ED Notes (Signed)
Spoke to Dr. Blinda Leatherwood in regards to pain medication request. MD allows 1 percocet prior to discharge.

## 2015-09-19 NOTE — ED Notes (Signed)
Request for pain med prior to discharge.

## 2015-09-19 NOTE — Discharge Instructions (Signed)
Ejercicios para la espalda °(Back Exercises) °Estos ejercicios ayudan a tratar y prevenir lesiones en la espalda. El objetivo es aumentar la fuerza de los músculos abdominales y dorsales y la flexibilidad de la espalda. Debe comenzar con estos ejercicios cuando ya no tenga dolor. Los ejercicios para la espalda incluyen: °· Inclinación de la pelvis - Recuéstese sobre la espalda con las rodillas flexionadas. Incline la pelvis hasta que la parte inferior de la espalda se apoye en el piso. Mantenga esta posición durante 5 a 10 segundos y repita entre 5 y 10 veces. °· Rodilla al pecho - Empuje primero una rodilla contra el pecho y mantenga durante 20 a 30 segundos; repita con la otra rodilla y luego con ambas a la vez. Esto puede realizarlo con la otra pierna extendida o flexionada, del modo en que se sienta más cómodo. °· Abdominales o despegar el cóccix del suelo empleando la musculatura abdominal - Flexione las rodillas 90 grados. Comience inclinando la pelvis y realice un ejercicio abdominal lento y parcial, elevando el tronco sólo entre 30 y 45 grados del suelo. Emplee al menos entre 2 y 3 segundos para cada abdominal. No realice los abdominales con las rodillas extendidas. Si le resulta difícil realizar abdominales parciales, simplemente haga lo que se explicó anteriormente, pero sólo contraiga los músculos abdominales y manténgalos tal como se le ha indicado. °· Inclinación de la cadera - Recuéstese sobre la espalda con las rodillas flexionadas a 90 grados. Empújese con los pies y los hombros mientras eleva la cadera un par de centímetros del suelo, mantenga durante 10 segundos y repita entre 5 y 10 veces. °· Arcos dorsales - Acuéstese sobre el estómago e impulse el tronco hacia atrás sobre los codos flexionados. Presione lentamente con las manos, formando un arco con la zona inferior de la espalda. Repita entre 3 y 5 veces. Al realizar las repeticiones, luego de un tiempo disminuirán la rigidez y las  molestias. °· Elevación de los hombros - Acuéstese hacia abajo con los brazos a los lados del cuerpo. Presione las caderas y el torso contra el suelo mientras eleva lentamente la cabeza y los hombros del suelo. °No exagere con los ejercicios, especialmente en el comienzo. Los ejercicios pueden causar alguna molestia leve en la espalda durante algunos minutos; sin embargo, si el dolor es muy intenso, o dura más de 15 minutos, no siga con la actividad física hasta que consulte al profesional que lo asiste. Los problemas en la espalda mejoran de manera lenta con esta terapia.  °Consulte al profesional para que lo ayude a planificar un programa de ejercicios adecuado para su espalda. °Document Released: 12/16/2005 Document Revised: 03/09/2012 °[employer]® Patient Information ©2015 [employer]. This information is not intended to replace advice given to you by your health care provider. Make sure you discuss any questions you have with your health care provider. ° °

## 2015-09-19 NOTE — ED Notes (Signed)
Dr. Alycia Rossetti at the bedside to update family.

## 2015-09-20 ENCOUNTER — Ambulatory Visit (HOSPITAL_COMMUNITY)
Admission: RE | Admit: 2015-09-20 | Discharge: 2015-09-20 | Disposition: A | Discharge status: Home / Self Care | Payer: Self-pay | Source: Ambulatory Visit | Attending: Physician Assistant | Admitting: Physician Assistant

## 2015-09-20 DIAGNOSIS — M4807 Spinal stenosis, lumbosacral region: Secondary | ICD-10-CM | POA: Insufficient documentation

## 2015-09-20 DIAGNOSIS — M5126 Other intervertebral disc displacement, lumbar region: Secondary | ICD-10-CM | POA: Insufficient documentation

## 2015-09-20 DIAGNOSIS — M5136 Other intervertebral disc degeneration, lumbar region: Secondary | ICD-10-CM | POA: Insufficient documentation

## 2015-09-20 DIAGNOSIS — M545 Low back pain: Secondary | ICD-10-CM | POA: Insufficient documentation

## 2015-09-20 DIAGNOSIS — M4806 Spinal stenosis, lumbar region: Secondary | ICD-10-CM | POA: Insufficient documentation

## 2015-09-28 ENCOUNTER — Other Ambulatory Visit (HOSPITAL_COMMUNITY): Payer: Self-pay

## 2015-09-28 ENCOUNTER — Encounter (HOSPITAL_COMMUNITY): Payer: Self-pay | Admitting: *Deleted

## 2015-09-28 NOTE — Progress Notes (Signed)
Called Dr. Barbaraann Faster office to request pre-op orders be signed. Spoke with [employer].

## 2015-09-28 NOTE — Progress Notes (Signed)
Spoke with pt via [employer], [place] [phone]) for pre-op call. Interpreter has been arranged to meet pt here tomorrow.

## 2015-09-29 ENCOUNTER — Encounter (HOSPITAL_COMMUNITY)
Admission: RE | Disposition: A | Discharge status: Home / Self Care | Payer: Self-pay | Source: Ambulatory Visit | Attending: Specialist

## 2015-09-29 ENCOUNTER — Observation Stay (HOSPITAL_COMMUNITY)
Admission: RE | Admit: 2015-09-29 | Discharge: 2015-09-30 | Disposition: A | Discharge status: Home / Self Care | Payer: Self-pay | Source: Ambulatory Visit | Attending: Specialist | Admitting: Specialist

## 2015-09-29 ENCOUNTER — Ambulatory Visit (HOSPITAL_COMMUNITY): Payer: MEDICAID | Admitting: Anesthesiology

## 2015-09-29 ENCOUNTER — Ambulatory Visit (HOSPITAL_COMMUNITY): Payer: Self-pay | Admitting: Anesthesiology

## 2015-09-29 ENCOUNTER — Ambulatory Visit (HOSPITAL_COMMUNITY): Payer: Self-pay

## 2015-09-29 ENCOUNTER — Encounter (HOSPITAL_COMMUNITY): Payer: Self-pay | Admitting: *Deleted

## 2015-09-29 DIAGNOSIS — M5126 Other intervertebral disc displacement, lumbar region: Secondary | ICD-10-CM | POA: Diagnosis present

## 2015-09-29 DIAGNOSIS — Z7982 Long term (current) use of aspirin: Secondary | ICD-10-CM | POA: Insufficient documentation

## 2015-09-29 DIAGNOSIS — M5127 Other intervertebral disc displacement, lumbosacral region: Principal | ICD-10-CM | POA: Insufficient documentation

## 2015-09-29 DIAGNOSIS — Z419 Encounter for procedure for purposes other than remedying health state, unspecified: Secondary | ICD-10-CM

## 2015-09-29 HISTORY — PX: LUMBAR LAMINECTOMY: SHX95

## 2015-09-29 HISTORY — DX: Other specified health status: Z78.9

## 2015-09-29 LAB — COMPREHENSIVE METABOLIC PANEL
ALBUMIN: 4.4 g/dL (ref 3.5–5.0)
ALT: 28 U/L (ref 17–63)
AST: 26 U/L (ref 15–41)
Alkaline Phosphatase: 65 U/L (ref 38–126)
Anion gap: 12 (ref 5–15)
BILIRUBIN TOTAL: 0.7 mg/dL (ref 0.3–1.2)
BUN: 10 mg/dL (ref 6–20)
CHLORIDE: 107 mmol/L (ref 101–111)
CO2: 22 mmol/L (ref 22–32)
CREATININE: 0.76 mg/dL (ref 0.61–1.24)
Calcium: 9.9 mg/dL (ref 8.9–10.3)
GFR calc Af Amer: >60 mL/min (ref >60)
GLUCOSE: 90 mg/dL (ref 65–99)
Potassium: 4 mmol/L (ref 3.5–5.1)
Sodium: 141 mmol/L (ref 135–145)
Total Protein: 7.6 g/dL (ref 6.5–8.1)

## 2015-09-29 LAB — CBC
HCT: 47.3 % (ref 39.0–52.0)
Hemoglobin: 16.6 g/dL (ref 13.0–17.0)
MCH: 28.6 pg (ref 26.0–34.0)
MCHC: 35.1 g/dL (ref 30.0–36.0)
MCV: 81.6 fL (ref 78.0–100.0)
PLATELETS: 330 10*3/uL (ref 150–400)
RBC: 5.8 MIL/uL (ref 4.22–5.81)
RDW: 12.9 % (ref 11.5–15.5)
WBC: 10.9 10*3/uL — AB (ref 4.0–10.5)

## 2015-09-29 LAB — SURGICAL PCR SCREEN
MRSA, PCR: NEGATIVE
Staphylococcus aureus: NEGATIVE

## 2015-09-29 SURGERY — MICRODISCECTOMY LUMBAR LAMINECTOMY
Anesthesia: General | Site: Spine Lumbar

## 2015-09-29 MED ORDER — DEXAMETHASONE 4 MG PO TABS
4.0000 mg | ORAL_TABLET | Freq: Four times a day (QID) | ORAL | Status: AC
  Administered 2015-09-30 (×2): 4 mg via ORAL
  Filled 2015-09-29 (×2): qty 1

## 2015-09-29 MED ORDER — PHENYLEPHRINE HCL 10 MG/ML IJ SOLN
INTRAMUSCULAR | Status: DC | PRN
  Administered 2015-09-29 (×4): 80 ug via INTRAVENOUS

## 2015-09-29 MED ORDER — PHENOL 1.4 % MT LIQD: 1.0000 | OROMUCOSAL | Status: DC | PRN

## 2015-09-29 MED ORDER — OXYCODONE-ACETAMINOPHEN 5-325 MG PO TABS
1.0000 | ORAL_TABLET | ORAL | Status: DC | PRN
  Administered 2015-09-29 – 2015-09-30 (×2): 2 via ORAL
  Filled 2015-09-29 (×3): qty 2

## 2015-09-29 MED ORDER — MENTHOL 3 MG MT LOZG: 1.0000 | LOZENGE | OROMUCOSAL | Status: DC | PRN

## 2015-09-29 MED ORDER — CYCLOBENZAPRINE HCL 10 MG PO TABS: 10.0000 mg | ORAL_TABLET | Freq: Three times a day (TID) | ORAL | Status: DC | PRN

## 2015-09-29 MED ORDER — ONDANSETRON HCL 4 MG/2ML IJ SOLN
INTRAMUSCULAR | Status: DC | PRN
  Administered 2015-09-29: 4 mg via INTRAVENOUS

## 2015-09-29 MED ORDER — ACETAMINOPHEN 650 MG RE SUPP: 650.0000 mg | RECTAL | Status: DC | PRN

## 2015-09-29 MED ORDER — DEXAMETHASONE SODIUM PHOSPHATE 4 MG/ML IJ SOLN
INTRAMUSCULAR | Status: DC | PRN
  Administered 2015-09-29: 4 mg via INTRAVENOUS

## 2015-09-29 MED ORDER — ZOLPIDEM TARTRATE 5 MG PO TABS: 5.0000 mg | ORAL_TABLET | Freq: Every evening | ORAL | Status: DC | PRN

## 2015-09-29 MED ORDER — CEFAZOLIN SODIUM-DEXTROSE 2-3 GM-% IV SOLR
2.0000 g | INTRAVENOUS | Status: AC
  Administered 2015-09-29: 2 g via INTRAVENOUS

## 2015-09-29 MED ORDER — ONDANSETRON HCL 4 MG/2ML IJ SOLN: 4.0000 mg | INTRAMUSCULAR | Status: DC | PRN

## 2015-09-29 MED ORDER — ACETAMINOPHEN 325 MG PO TABS: 650.0000 mg | ORAL_TABLET | ORAL | Status: DC | PRN

## 2015-09-29 MED ORDER — CEFAZOLIN SODIUM-DEXTROSE 2-3 GM-% IV SOLR
INTRAVENOUS | Status: AC
  Filled 2015-09-29: qty 50

## 2015-09-29 MED ORDER — MORPHINE SULFATE (PF) 2 MG/ML IV SOLN: 1.0000 mg | INTRAVENOUS | Status: DC | PRN

## 2015-09-29 MED ORDER — DEXAMETHASONE SODIUM PHOSPHATE 4 MG/ML IJ SOLN: 4.0000 mg | Freq: Four times a day (QID) | INTRAMUSCULAR | Status: AC

## 2015-09-29 MED ORDER — BUPIVACAINE HCL (PF) 0.5 % IJ SOLN
INTRAMUSCULAR | Status: AC
  Filled 2015-09-29: qty 30

## 2015-09-29 MED ORDER — HYDROCODONE-ACETAMINOPHEN 5-325 MG PO TABS: 1.0000 | ORAL_TABLET | Freq: Four times a day (QID) | ORAL | Status: DC | PRN

## 2015-09-29 MED ORDER — LACTATED RINGERS IV SOLN: INTRAVENOUS | Status: DC

## 2015-09-29 MED ORDER — THROMBIN 20000 UNITS EX SOLR
CUTANEOUS | Status: AC
  Filled 2015-09-29: qty 20000

## 2015-09-29 MED ORDER — FENTANYL CITRATE (PF) 250 MCG/5ML IJ SOLN
INTRAMUSCULAR | Status: AC
  Filled 2015-09-29: qty 5

## 2015-09-29 MED ORDER — LACTATED RINGERS IV SOLN
INTRAVENOUS | Status: DC
  Administered 2015-09-29 (×2): via INTRAVENOUS

## 2015-09-29 MED ORDER — ALUM & MAG HYDROXIDE-SIMETH 200-200-20 MG/5ML PO SUSP: 30.0000 mL | Freq: Four times a day (QID) | ORAL | Status: DC | PRN

## 2015-09-29 MED ORDER — DOCUSATE SODIUM 100 MG PO CAPS
100.0000 mg | ORAL_CAPSULE | Freq: Two times a day (BID) | ORAL | Status: DC
  Administered 2015-09-29 – 2015-09-30 (×2): 100 mg via ORAL
  Filled 2015-09-29 (×2): qty 1

## 2015-09-29 MED ORDER — POLYETHYLENE GLYCOL 3350 17 G PO PACK: 17.0000 g | PACK | Freq: Every day | ORAL | Status: DC | PRN

## 2015-09-29 MED ORDER — SODIUM CHLORIDE 0.9 % IJ SOLN: 3.0000 mL | Freq: Two times a day (BID) | INTRAMUSCULAR | Status: DC

## 2015-09-29 MED ORDER — CEFAZOLIN SODIUM 1-5 GM-% IV SOLN
1.0000 g | Freq: Three times a day (TID) | INTRAVENOUS | Status: AC
  Administered 2015-09-29 – 2015-09-30 (×2): 1 g via INTRAVENOUS
  Filled 2015-09-29 (×2): qty 50

## 2015-09-29 MED ORDER — ROCURONIUM BROMIDE 100 MG/10ML IV SOLN
INTRAVENOUS | Status: DC | PRN
  Administered 2015-09-29: 50 mg via INTRAVENOUS

## 2015-09-29 MED ORDER — FENTANYL CITRATE (PF) 100 MCG/2ML IJ SOLN
50.0000 ug | Freq: Once | INTRAMUSCULAR | Status: AC
  Administered 2015-09-29: 50 ug via INTRAVENOUS

## 2015-09-29 MED ORDER — HYDROMORPHONE HCL 1 MG/ML IJ SOLN
0.2500 mg | INTRAMUSCULAR | Status: DC | PRN
  Administered 2015-09-29 (×2): 0.25 mg via INTRAVENOUS

## 2015-09-29 MED ORDER — HYDROMORPHONE HCL 1 MG/ML IJ SOLN
INTRAMUSCULAR | Status: AC
  Administered 2015-09-29: 0.25 mg via INTRAVENOUS
  Filled 2015-09-29: qty 1

## 2015-09-29 MED ORDER — SODIUM CHLORIDE 0.9 % IV SOLN: 250.0000 mL | INTRAVENOUS | Status: DC

## 2015-09-29 MED ORDER — SODIUM CHLORIDE 0.9 % IJ SOLN: 3.0000 mL | INTRAMUSCULAR | Status: DC | PRN

## 2015-09-29 MED ORDER — GLYCOPYRROLATE 0.2 MG/ML IJ SOLN
INTRAMUSCULAR | Status: DC | PRN
  Administered 2015-09-29: 0.6 mg via INTRAVENOUS

## 2015-09-29 MED ORDER — NEOSTIGMINE METHYLSULFATE 10 MG/10ML IV SOLN
INTRAVENOUS | Status: DC | PRN
  Administered 2015-09-29: 3 mg via INTRAVENOUS

## 2015-09-29 MED ORDER — PROPOFOL 10 MG/ML IV BOLUS
INTRAVENOUS | Status: AC
  Filled 2015-09-29: qty 20

## 2015-09-29 MED ORDER — MUPIROCIN 2 % EX OINT
1.0000 "application " | TOPICAL_OINTMENT | Freq: Once | CUTANEOUS | Status: AC
  Administered 2015-09-29: 1 via TOPICAL
  Filled 2015-09-29: qty 22

## 2015-09-29 MED ORDER — KETOROLAC TROMETHAMINE 30 MG/ML IJ SOLN
30.0000 mg | Freq: Once | INTRAMUSCULAR | Status: AC
  Administered 2015-09-29: 30 mg via INTRAVENOUS
  Filled 2015-09-29: qty 1

## 2015-09-29 MED ORDER — HYDROCODONE-ACETAMINOPHEN 5-325 MG PO TABS
1.0000 | ORAL_TABLET | ORAL | Status: DC | PRN
  Filled 2015-09-29: qty 2

## 2015-09-29 MED ORDER — FENTANYL CITRATE (PF) 100 MCG/2ML IJ SOLN
INTRAMUSCULAR | Status: AC
  Administered 2015-09-29: 250 ug via INTRAVENOUS
  Filled 2015-09-29: qty 2

## 2015-09-29 MED ORDER — PROPOFOL 10 MG/ML IV BOLUS
INTRAVENOUS | Status: DC | PRN
  Administered 2015-09-29: 200 mg via INTRAVENOUS

## 2015-09-29 MED ORDER — CHLORHEXIDINE GLUCONATE 4 % EX LIQD: 60.0000 mL | Freq: Once | CUTANEOUS | Status: DC

## 2015-09-29 MED ORDER — HYDROCODONE-ACETAMINOPHEN 5-325 MG PO TABS: 1.0000 | ORAL_TABLET | Freq: Four times a day (QID) | ORAL | Status: AC | PRN

## 2015-09-29 MED ORDER — MIDAZOLAM HCL 5 MG/5ML IJ SOLN
INTRAMUSCULAR | Status: DC | PRN
  Administered 2015-09-29: 2 mg via INTRAVENOUS

## 2015-09-29 MED ORDER — BUPIVACAINE LIPOSOME 1.3 % IJ SUSP
20.0000 mL | Freq: Once | INTRAMUSCULAR | Status: DC
  Filled 2015-09-29: qty 20

## 2015-09-29 MED ORDER — MIDAZOLAM HCL 2 MG/2ML IJ SOLN
INTRAMUSCULAR | Status: AC
  Filled 2015-09-29: qty 4

## 2015-09-29 MED ORDER — LIDOCAINE HCL (CARDIAC) 20 MG/ML IV SOLN
INTRAVENOUS | Status: DC | PRN
  Administered 2015-09-29: 20 mg via INTRAVENOUS

## 2015-09-29 MED ORDER — BISACODYL 5 MG PO TBEC: 5.0000 mg | DELAYED_RELEASE_TABLET | Freq: Every day | ORAL | Status: DC | PRN

## 2015-09-29 MED ORDER — CYCLOBENZAPRINE HCL 10 MG PO TABS: 10.0000 mg | ORAL_TABLET | Freq: Every day | ORAL | Status: AC | PRN

## 2015-09-29 MED ORDER — FLEET ENEMA 7-19 GM/118ML RE ENEM: 1.0000 | ENEMA | Freq: Once | RECTAL | Status: DC | PRN

## 2015-09-29 SURGICAL SUPPLY — 51 items
BUR ROUND FLUTED 4 SOFT TCH (BURR) ×2 IMPLANT
BUR ROUND FLUTED 4MM SOFT TCH (BURR) ×1
CANISTER SUCTION 2500CC (MISCELLANEOUS) ×3 IMPLANT
CLOSURE STERI-STRIP 1/2X4 (GAUZE/BANDAGES/DRESSINGS) ×1
CLSR STERI-STRIP ANTIMIC 1/2X4 (GAUZE/BANDAGES/DRESSINGS) ×2 IMPLANT
COVER MAYO STAND STRL (DRAPES) ×3 IMPLANT
COVER SURGICAL LIGHT HANDLE (MISCELLANEOUS) ×3 IMPLANT
DERMABOND ADVANCED (GAUZE/BANDAGES/DRESSINGS) ×2
DERMABOND ADVANCED .7 DNX12 (GAUZE/BANDAGES/DRESSINGS) ×1 IMPLANT
DRAPE C-ARM 42X72 X-RAY (DRAPES) IMPLANT
DRAPE MICROSCOPE LEICA (MISCELLANEOUS) ×3 IMPLANT
DRAPE PROXIMA HALF (DRAPES) IMPLANT
DRAPE SURG 17X23 STRL (DRAPES) ×12 IMPLANT
DRSG MEPILEX BORDER 4X4 (GAUZE/BANDAGES/DRESSINGS) ×3 IMPLANT
DRSG MEPILEX BORDER 4X8 (GAUZE/BANDAGES/DRESSINGS) IMPLANT
DURAPREP 26ML APPLICATOR (WOUND CARE) ×3 IMPLANT
ELECT BLADE 4.0 EZ CLEAN MEGAD (MISCELLANEOUS) ×3
ELECT CAUTERY BLADE 6.4 (BLADE) ×3 IMPLANT
ELECT REM PT RETURN 9FT ADLT (ELECTROSURGICAL) ×3
ELECTRODE BLDE 4.0 EZ CLN MEGD (MISCELLANEOUS) ×1 IMPLANT
ELECTRODE REM PT RTRN 9FT ADLT (ELECTROSURGICAL) ×1 IMPLANT
GLOVE BIOGEL PI IND STRL 8 (GLOVE) ×1 IMPLANT
GLOVE BIOGEL PI INDICATOR 8 (GLOVE) ×2
GLOVE ECLIPSE 8.5 STRL (GLOVE) ×3 IMPLANT
GLOVE ORTHO TXT STRL SZ7.5 (GLOVE) ×3 IMPLANT
GLOVE SURG 8.5 LATEX PF (GLOVE) ×3 IMPLANT
GOWN STRL REUS W/ TWL LRG LVL3 (GOWN DISPOSABLE) ×1 IMPLANT
GOWN STRL REUS W/TWL 2XL LVL3 (GOWN DISPOSABLE) ×6 IMPLANT
GOWN STRL REUS W/TWL LRG LVL3 (GOWN DISPOSABLE) ×2
KIT BASIN OR (CUSTOM PROCEDURE TRAY) ×3 IMPLANT
KIT ROOM TURNOVER OR (KITS) ×3 IMPLANT
NEEDLE SPNL 18GX3.5 QUINCKE PK (NEEDLE) ×6 IMPLANT
NS IRRIG 1000ML POUR BTL (IV SOLUTION) ×3 IMPLANT
PACK LAMINECTOMY ORTHO (CUSTOM PROCEDURE TRAY) ×3 IMPLANT
PAD ARMBOARD 7.5X6 YLW CONV (MISCELLANEOUS) ×6 IMPLANT
PATTIES SURGICAL .5 X.5 (GAUZE/BANDAGES/DRESSINGS) IMPLANT
PATTIES SURGICAL .75X.75 (GAUZE/BANDAGES/DRESSINGS) IMPLANT
SPONGE LAP 4X18 X RAY DECT (DISPOSABLE) IMPLANT
SPONGE SURGIFOAM ABS GEL 100 (HEMOSTASIS) ×3 IMPLANT
SUT VIC AB 1 CTX 36 (SUTURE) ×2
SUT VIC AB 1 CTX36XBRD ANBCTR (SUTURE) ×1 IMPLANT
SUT VIC AB 2-0 CT1 27 (SUTURE) ×2
SUT VIC AB 2-0 CT1 TAPERPNT 27 (SUTURE) ×1 IMPLANT
SUT VIC AB 3-0 X1 27 (SUTURE) ×3 IMPLANT
SUT VICRYL 0 UR6 27IN ABS (SUTURE) ×3 IMPLANT
SYR 20CC LL (SYRINGE) ×3 IMPLANT
SYR CONTROL 10ML LL (SYRINGE) ×3 IMPLANT
TOWEL OR 17X24 6PK STRL BLUE (TOWEL DISPOSABLE) ×3 IMPLANT
TOWEL OR 17X26 10 PK STRL BLUE (TOWEL DISPOSABLE) ×3 IMPLANT
TRAY FOLEY CATH 16FRSI W/METER (SET/KITS/TRAYS/PACK) IMPLANT
WATER STERILE IRR 1000ML POUR (IV SOLUTION) ×3 IMPLANT

## 2015-09-29 NOTE — Anesthesia Preprocedure Evaluation (Addendum)
Anesthesia Evaluation  Patient identified by MRN, date of birth, ID band Patient awake    Reviewed: Allergy & Precautions, H&P , NPO status , Patient's Chart, lab work & pertinent test results  Airway Mallampati: II  TM Distance: >3 FB Neck ROM: Full    Dental no notable dental hx. (+) Teeth Intact, Dental Advisory Given   Pulmonary neg pulmonary ROS,    Pulmonary exam normal breath sounds clear to auscultation       Cardiovascular negative cardio ROS   Rhythm:Regular Rate:Normal     Neuro/Psych negative neurological ROS  negative psych ROS   GI/Hepatic negative GI ROS, Neg liver ROS,   Endo/Other  negative endocrine ROS  Renal/GU negative Renal ROS  negative genitourinary   Musculoskeletal   Abdominal   Peds  Hematology negative hematology ROS (+)   Anesthesia Other Findings   Reproductive/Obstetrics negative OB ROS                            Anesthesia Physical Anesthesia Plan  ASA: I  Anesthesia Plan: General   Post-op Pain Management:    Induction: Intravenous  Airway Management Planned: Oral ETT  Additional Equipment:   Intra-op Plan:   Post-operative Plan: Extubation in OR  Informed Consent: I have reviewed the patients History and Physical, chart, labs and discussed the procedure including the risks, benefits and alternatives for the proposed anesthesia with the patient or authorized representative who has indicated his/her understanding and acceptance.   Dental advisory given  Plan Discussed with: CRNA  Anesthesia Plan Comments:         Anesthesia Quick Evaluation  

## 2015-09-29 NOTE — H&P (Signed)
Chief Complaint: low back pain and right lower extremity radiculopathy.   History: 29 yo hispanic male presented to our office with worsening low back pain and right lower extremity radiculopathy and weakness.  Referred by Dr Magnus Ivan. Had MRI lumbar and this showed large L5-S1 HNP.  No associated fever, chills.  Pain constant but aggravated with sitting, walking, bending.  Denies bowel or bladder incontinence.  Failed conservative treatment.    Past Medical History  Diagnosis Date  . Medical history non-contributory     No Known Allergies  No current facility-administered medications on file prior to encounter.   Current Outpatient Prescriptions on File Prior to Encounter  Medication Sig Dispense Refill  . aspirin EC 325 MG tablet Take 2,600 mg by mouth once.    . cyclobenzaprine (FLEXERIL) 10 MG tablet Take 1 tablet (10 mg total) by mouth daily as needed for muscle spasms. 10 tablet 0  . ibuprofen (ADVIL,MOTRIN) 800 MG tablet Take 1 tablet (800 mg total) by mouth every 8 (eight) hours as needed. 30 tablet 0  . predniSONE (DELTASONE) 20 MG tablet Take 2 tablets (40 mg total) by mouth once. 8 tablet 0    ROS:  No fever, chills, cardiac, pulm, gi, gu issues.    Physical Exam: Head normocephalic.  Eomi.  Oral mucosa pink and moist.  Cervical spine unremarkable.  No respiratory distress.  Heart normal rate and rhythm.  abd nondistended. Gait antalgic.  Lumbar paraspinal tenderness.  Positive right SLR and contralateral SLR.  Neg log roll.  bilat calves nontender.  Skin warm and dry.  Alert and oriented.   Image: Mr Lumbar Spine Wo Contrast  09/20/2015   CLINICAL DATA:  29 year old male with back pain radiating to the right buttocks and down the right lower extremity. Initial encounter.  EXAM: MRI LUMBAR SPINE WITHOUT CONTRAST  TECHNIQUE: Multiplanar, multisequence MR imaging of the lumbar spine was performed. No intravenous contrast was administered.  COMPARISON:  CT Abdomen and Pelvis  09/18/2015  FINDINGS: Normal lumbar segmentation demonstrated on the comparison. Stable vertebral height and alignment with straightening of lordosis. Mild motion artifact on sagittal STIR imaging. No marrow edema or evidence of acute osseous abnormality.  Visualized lower thoracic spinal cord is normal with conus medularis at L1.  Visualized abdominal viscera and paraspinal soft tissues are within normal limits.  The T11-T12 through L3-L4 disc spaces are normal.  L4-L5: Minimal left eccentric disc bulging. No significant stenosis.  L5-S1: Disc desiccation. Moderate size lobulated right paracentral and lateral recess disc extrusion (series 200, image 7 and series 500, image 31). Severe stenosis at the right lateral recess affecting the course of the descending right S1 and S2 nerve roots. Mild spinal stenosis overall. Superimposed circumferential disc bulging and endplate spurring resulting in up to mild L5 foraminal stenosis.  IMPRESSION: 1. L5-S1 disc extrusion into the right lateral recess severely affecting the descending right S1 and S2 nerve roots, and resulting in mild overall spinal stenosis. 2. Superimposed mild chronic degenerative endplate changes at L5-S1 with mild L5 foraminal stenosis. Minimal disc degeneration at L4-L5.   Electronically Signed   By: Odessa Fleming M.D.   On: 09/20/2015 11:31   Ct Abdomen Pelvis W Contrast  09/19/2015   CLINICAL DATA:  Back and RIGHT buttocks pain extending down RIGHT leg, no known injury, RIGHT lower back and RIGHT lower quadrant pain, tenderness palpation at RIGHT inguinal crease and RIGHT buttock  EXAM: CT ABDOMEN AND PELVIS WITH CONTRAST  TECHNIQUE: Multidetector CT imaging of the  abdomen and pelvis was performed using the standard protocol following bolus administration of intravenous contrast. Sagittal and coronal MPR images reconstructed from axial data set.  CONTRAST:  OMNIPAQUE IOHEXOL 300 MG/ML SOLN IV. Dilute oral contrast.  COMPARISON:  None  FINDINGS:  Bibasilar atelectasis.  Liver, spleen, pancreas, kidneys, and adrenal glands normal appearance.  Unremarkable gallbladder and appendix by CT.  Mid bladder and ureters normal appearance.  Redundant sigmoid colon.  Bowel loops otherwise normal.  Few respiratory motion artifacts in RIGHT lower quadrant.  No mass, adenopathy, free air or free fluid.  No evidence of hernia.  Osseous structures unremarkable.  IMPRESSION: No acute intra-abdominal or intrapelvic abnormalities.   Electronically Signed   By: Ulyses Southward M.D.   On: 09/19/2015 00:27    A/P: Worsening low back pain and right lower extremity radiculopathy.  Right L5-S1 HNP.   Will proceed with right L5-S1 microdiscectomy.  Surgical procedure along with possible risks and complications discussed.  All questions answered.  Understands that he may not be able to return to work until at least 6-12 week postop.  Interpreter was present.   Patient examined and lab reviewed with Barry Dienes, PA-C.

## 2015-09-29 NOTE — Transfer of Care (Signed)
Immediate Anesthesia Transfer of Care Note  Patient: John Moss  Procedure(s) Performed: Procedure(s): RIGHT L5-S1 MICRODISCECTOMY  (N/A)  Patient Location: PACU  Anesthesia Type:General  Level of Consciousness: sedated  Airway & Oxygen Therapy: Patient Spontanous Breathing and Patient connected to nasal cannula oxygen  Post-op Assessment: Report given to RN and Post -op Vital signs reviewed and stable  Post vital signs: stable  Last Vitals:  Filed Vitals:   09/29/15 1322  BP: 152/94  Pulse: 103  Temp: 36.7 C  Resp: 18    Complications: No apparent anesthesia complications

## 2015-09-29 NOTE — Plan of Care (Signed)
Problem: Consults Goal: Diagnosis - Spinal Surgery Outcome: Completed/Met Date Met:  09/29/15 Microdiscectomy L5-S1

## 2015-09-29 NOTE — Discharge Instructions (Addendum)
   No lifting greater than 10 lbs. Avoid bending, stooping and twisting. Walk in house for first week them may start to get out slowly increasing distance up to one half mile by 3 weeks post op. Keep incision dry for 3 days, may use tegaderm or similar water impervious dressing.  

## 2015-09-29 NOTE — Progress Notes (Signed)
C/o pain Dr Sampson Goon called and informed new orders noted

## 2015-09-29 NOTE — Brief Op Note (Signed)
09/29/2015  6:57 PM  PATIENT:  John Moss  29 y.o. male  PRE-OPERATIVE DIAGNOSIS:  Right L5-S1 herniated nucleus pulposus  POST-OPERATIVE DIAGNOSIS:  Right L5-S1 herniated nucleus pulposus  calcified herniated disc with retrolisthesis L5 on S1, ventral ridge.  PROCEDURE:  Procedure(s): RIGHT L5-S1 MICRODISCECTOMY  (N/A)  SURGEON:  Surgeon(s) and Role:    * Kerrin Champagne, MD - Primary  PHYSICIAN ASSISTANT:CRNFA   ANESTHESIA:   local and general, Dr. Jean Rosenthal,  EBL:  Total I/O In: 1000 [I.V.:1000] Out: 100 [Blood:100]  BLOOD ADMINISTERED:none  DRAINS: none   LOCAL MEDICATIONS USED:  MARCAINE 0.5% 1:1 EXPAREL 1.3% Amount: 20 ml  SPECIMEN:  No Specimen  DISPOSITION OF SPECIMEN:  N/A  COUNTS:  YES  TOURNIQUET:  * No tourniquets in log *  DICTATION: .Dragon Dictation  PLAN OF CARE: Admit for overnight observation  PATIENT DISPOSITION:  PACU - hemodynamically stable.   Delay start of Pharmacological VTE agent (>24hrs) due to surgical blood loss or risk of bleeding: yes

## 2015-09-29 NOTE — Progress Notes (Signed)
Placed on tele to monitor  

## 2015-09-29 NOTE — Anesthesia Procedure Notes (Signed)
Procedure Name: Intubation Date/Time: 09/29/2015 5:16 PM Performed by: Reine Just Pre-anesthesia Checklist: Patient identified, Emergency Drugs available, Suction available, Patient being monitored and Timeout performed Patient Re-evaluated:Patient Re-evaluated prior to inductionOxygen Delivery Method: Circle system utilized and Simple face mask Preoxygenation: Pre-oxygenation with 100% oxygen Intubation Type: IV induction Ventilation: Mask ventilation without difficulty Laryngoscope Size: Miller and 3 Grade View: Grade I Tube type: Oral Tube size: 7.5 mm Number of attempts: 1 Airway Equipment and Method: Patient positioned with wedge pillow and Stylet Placement Confirmation: ETT inserted through vocal cords under direct vision,  positive ETCO2 and breath sounds checked- equal and bilateral Secured at: 22 cm Tube secured with: Tape Dental Injury: Teeth and Oropharynx as per pre-operative assessment

## 2015-09-29 NOTE — Interval H&P Note (Signed)
History and Physical Interval Note:  09/29/2015 4:09 PM  John Moss  has presented today for surgery, with the diagnosis of Right L5-S1 herniated nucleus pulposus  The various methods of treatment have been discussed with the patient and family. After consideration of risks, benefits and other options for treatment, the patient has consented to  Procedure(s): RIGHT L5-S1 MICRODISCECTOMY  (N/A) as a surgical intervention .  The patient's history has been reviewed, patient examined, no change in status, stable for surgery.  I have reviewed the patient's chart and labs.  Questions were answered to the patient's satisfaction.     Chelle Cayton E

## 2015-09-29 NOTE — Op Note (Signed)
09/29/2015  7:06 PM  PATIENT:  John Moss  29 y.o. male  MRN: [id number]  OPERATIVE REPORT  PRE-OPERATIVE DIAGNOSIS:  Right L5-S1 herniated nucleus pulposus  POST-OPERATIVE DIAGNOSIS:  Right L5-S1 herniated nucleus pulposus Calcified herniated disc central and right L5-S1, retrolisthesis L5 on S1.   PROCEDURE:  Procedure(s): RIGHT L5-S1 MICRODISCECTOMY     SURGEON:  Jessy Oto, MD     ASSISTANT: Laure Kidney, CRNFA    ANESTHESIA:  General, supplemented with local marcaine 0.5% and exparel 1.3% Total 20 cc.  Dr. Glennon Mac.    COMPLICATIONS:  None.     EBL 100cc  PROCEDURE:The patient was met in the holding area, and the appropriate right Lumbar level L5-S1 identified and marked with "x" and my initials.The patient was then transported to OR and was placed under general anesthesia without difficulty. The patient received appropriate preoperative antibiotic prophylaxis. The patient after intubation atraumatically was transferred to the operating room table, prone position, Wilson frame, sliding OR table. All pressure points were well padded. The arms in 90-90 well-padded at the elbows. Standard prep with DuraPrep solution lower dorsal spine to the mid sacral segment. Draped in the usual manner iodine Vi-Drape was used. Time-out procedure was called and correct. 1 x 18-gauge spinal needle was then inserted at the expected L5-S1 level.  X-table lateral xray was draped sterilely to the field and used to identify the spinal needle position. The needle was at the lower aspect of the lamina of L5. Skin superior to this was then infiltrated withmarcaine 0.5% and exparel 1.3% total of 20 cc used. An incision approximately an inch inch and a half in length was then made through skin and subcutaneous layers in line with the right side of the expected midline just superior to the spinal needle entry point. An incision made into the right lumbosacral fascia approximately an inch in length .    Cobb elevator was then introduced into the incision site and used to carefully form subperiosteal movement of the right paralumbar muscles off of the posterior lamina of the expected L5-S1 level.  The depth measured off of the cobb elevator and Boss McCoullough retractor placed on the posterior aspect of the lamina at the expected L5-S1 level. This was sterilely attached to the articulating arm and it's up right which had been attached the OR table sterilely. Cross table lateral xray was identified a Penfield #4 at the appropriate level L5-S1 with Penfield 4 in the L5-S1 facet.. The operating room microscope sterilely draped brought into the field. Under the operating room microscope, the L5-S1 interspace carefully debrided the small amount of muscle attachment here and high-speed bur used to drill the medial aspect of the inferior articular process of L5 approximately 10%. 2 mm Kerrison then used to enter the spinal canal over the superior aspect of the S1 lamina carefully using the Kerrison to debris the attachment as a curet. Foraminotomy was then performed over theS1 nerve root. The medial 10% superior articular process of S1 and then resected using 2 mm Kerrison. This allowed for identification of the thecal sac. Penfield 4 was then used to carefully mobilize the thecal sac medially and the S1 nerve root identified within the lateral recess flattened over the posterior aspect of the herniated disc. Carefully the lateral aspect of the S1 nerve root was identified and a Penfield 4 was used to mobilize the nerve medially such that the herniated disc was visible with microscope. Using a Penfield 4 for retraction and a 15  blade scalpel was used to incise the posterior longitudinal ligament within the lateral recess on the left side longitudinally. Disc material immediately extruded and this was removed using micropituitary rongeurs and nerve hook nerve root and then more easily able to be mobilized medially and  retracted using a love retractor. Further foraminotomies was performed over the L5 nerve root the nerve root was noted to be free without further compression. The nerve root able to be retracted along the medial aspect of the S1pedicle and disc material found to be subligamentous at this level was further resected current pituitary rongeurs. Ligamentum flavum was further debrided superiorly to the level L5-S1 disc. Had a moderate amount of further resection of the S1 lamina inferiorly was performed. With this then the disc space at L5-S1 was easily visualized and entry into the disc at the sided disc herniation was possible using a Penfield 4 intraoperative Lateral radiograph was used to identify the L5-S1 disc with the Penfield 4 In place just below the disc space.  Micropituitary was used to further debride this material superficially from the posterior aspect of the intervertebral disc is posterior lateral aspect of the disc. Small amount of further disc material was found subligamentous extending inferiorly from the disc this was removed using micropituitary rongeurs. Ligamentum flavum was debrided and lateral recess along the medial aspect L5-S1 facet no further decompression was necessary. Ball tip nerve probe was then able to carefully palpate the neuroforamen for L5 and S1 finding these to be well decompressed. Bleeding was then controlled using thrombin-soaked Gelfoam small cottonoids.  Small amount of bleeding within the soft tissue mass the laminotomy area was controlled using bipolar electrocautery. Irrigation was carried out using copious amounts of irrigant solution. All Gelfoam  were then removed. No significant active bleeding present at the time of removal. All instruments sponge counts were correct traction system was then carefully removed the Boss retractors with bipolar electrocautery of any small bleeders. Lumbodorsal fascia was then carefully approximated with interrupted 0 Vicryl sutures, UR  6 needle deep subcutaneous layers were approximated with interrupted 0 Vicryl sutures on UR 6 the appear subcutaneous layers approximated with interrupted 2-0 Vicryl sutures and the skin closed with a running subcutaneous stitch of 4-0 Vicryl. Dermabond was applied allowed to dry and then Mepilex bandage applied. Patient was then carefully returned to supine position on a stretcher, reactivated and extubated. He was then returned to recovery room in satisfactory condition.  Laure Kidney CRNFA  perform the duties of Pensions consultant during this case. she was present from the beginning of the case to the end of the case assisting in transfer the patient from his stretcher to the OR table and back to the stretcher at the end of the case. Assisted in careful retraction and suction of the laminectomy site delicate neural structures operating under the operating room microscope. She performed closure of the incision from the fascia to the skin applying the dressing.     NITKA,JAMES E 09/29/2015, 7:06 PM

## 2015-09-29 NOTE — Anesthesia Postprocedure Evaluation (Signed)
Anesthesia Post Note  Patient: John Moss  Procedure(s) Performed: Procedure(s) (LRB): RIGHT L5-S1 MICRODISCECTOMY  (N/A)  Anesthesia type: general  Patient location: PACU  Post pain: Pain level controlled  Post assessment: Patient's Cardiovascular Status Stable  Last Vitals:  Filed Vitals:   09/29/15 1950  BP: 132/76  Pulse: 80  Temp:   Resp: 12    Post vital signs: Reviewed and stable  Level of consciousness: sedated  Complications: No apparent anesthesia complications

## 2015-09-30 NOTE — Evaluation (Signed)
Occupational Therapy Evaluation Patient Details Name: John Moss MRN: [id number] DOB: Jun 20, 1986 Today's Date: 09/30/2015    History of Present Illness 29 yo male with L5-S1 HNP and received microdiscectomy and laminectomy   Clinical Impression   Patient evaluated by Occupational Therapy with no further acute OT needs identified. All education has been completed and the patient has no further questions. All education completed. Pt able to complete ADLs with supervision.  See below for any follow-up Occupational Therapy or equipment needs. OT is signing off. Thank you for this referral.        Follow Up Recommendations  No OT follow up;Supervision/Assistance - 24 hour    Equipment Recommendations  3 in 1 bedside comode    Recommendations for Other Services       Precautions / Restrictions Precautions Precautions: Back;Fall Precaution Booklet Issued: No Precaution Comments: reviewed but did not have Spanish instructions      Mobility Bed Mobility Overal bed mobility: Needs Assistance Bed Mobility: Sidelying to Sit;Rolling Rolling: Supervision Sidelying to sit: Supervision       General bed mobility comments: Pt required verbal cue to log roll   Transfers Overall transfer level: Needs assistance Equipment used: Rolling walker (2 wheeled);1 person hand held assist Transfers: Sit to/from [employer] Sit to Stand: Supervision Stand pivot transfers: Supervision            Balance                                            ADL Overall ADL's : Needs assistance/impaired     Grooming: Oral care Grooming Details (indicate cue type and reason): reviewed safety with oral care and shaving.  Instructed pt to avoid bending and to spit in cup during oral care Upper Body Bathing: Supervision/ safety;Sitting   Lower Body Bathing: Supervison/ safety;Sit to/from stand   Upper Body Dressing : Supervision/safety;Set up;Sitting    Lower Body Dressing: Supervision/safety;Sit to/from stand   Toilet Transfer: Supervision/safety;Ambulation;BSC;Regular Toilet;RW Toilet Transfer Details (indicate cue type and reason): Pt was instructed in use of 3in1 over commode.  Instructed him how to adjust it Toileting- Architect and Hygiene: Supervision/safety;Sit to/from stand Toileting - Clothing Manipulation Details (indicate cue type and reason): Pt instructed to avoid bending and twisting while performing toilet hygiene Tub/ Shower Transfer: Supervision/safety;Tub transfer;Ambulation;3 in 1;Rolling walker Tub/Shower Transfer Details (indicate cue type and reason): Pt and wife instructed to use 3in1 in bathtub as a shower seat.  They verbalized understanding  Functional mobility during ADLs: Supervision/safety;Rolling walker General ADL Comments: Pt able to cross ankles over knees to access feet for LB ADLs.  Pt was provided with reacher from [employer]     Praxis      Pertinent Vitals/Pain Pain Assessment: Faces Faces Pain Scale: Hurts a little bit Pain Location: back Pain Descriptors / Indicators: Guarding Pain Intervention(s): Monitored during session     Hand Dominance     Extremity/Trunk Assessment Upper Extremity Assessment Upper Extremity Assessment: Overall WFL for tasks assessed   Lower Extremity Assessment Lower Extremity Assessment: Overall WFL for tasks assessed   Cervical / Trunk Assessment Cervical / Trunk Assessment: Normal   Communication Communication Communication: Prefers language other than [place];Interpreter utilized;Other (comment)   Cognition Arousal/Alertness: Awake/alert Behavior During Therapy: WFL for tasks assessed/performed Overall Cognitive Status: Within Functional  Limits for tasks assessed                     General Comments       Exercises       Shoulder Instructions      Home Living Family/patient expects to be  discharged to:: Private residence Living Arrangements: Spouse/significant other Available Help at Discharge: Family;Available 24 hours/day Type of Home: House Home Access: Stairs to enter [employer] of Steps: 6 Entrance Stairs-Rails: Left Home Layout: One level     Bathroom Shower/Tub: Producer, television/film/video: Standard Bathroom Accessibility: No   Home Equipment: None   Additional Comments: Has been independent and not using any equipment      Prior Functioning/Environment Level of Independence: Independent             OT Diagnosis: Generalized weakness;Acute pain   OT Problem List:     OT Treatment/Interventions:      OT Goals(Current goals can be found in the care plan section) Acute Rehab OT Goals Patient Stated Goal: none stated OT Goal Formulation: With patient  OT Frequency:     Barriers to D/C:            Co-evaluation              End of Session Equipment Utilized During Treatment: Rolling walker  Activity Tolerance: Patient tolerated treatment well Patient left: in chair;with call bell/phone within reach;with family/visitor present   Time: 9147-8295 OT Time Calculation (min): 25 min Charges:  OT General Charges $OT Visit: 1 Procedure OT Evaluation $Initial OT Evaluation Tier I: 1 Procedure OT Treatments $Self Care/Home Management : 8-22 mins G-Codes: OT G-codes **NOT FOR INPATIENT CLASS** Functional Limitation: Self care Self Care Current Status (A2130): At least 1 percent but less than 20 percent impaired, limited or restricted Self Care Goal Status (Q6578): At least 1 percent but less than 20 percent impaired, limited or restricted Self Care Discharge Status [phone]): At least 1 percent but less than 20 percent impaired, limited or restricted  John Moss M 09/30/2015, 5:03 PM

## 2015-09-30 NOTE — Progress Notes (Signed)
Pt stable Pain ok Pt walking Dc today Dressing dry

## 2015-09-30 NOTE — Care Management Note (Signed)
Case Management Note  Patient Details  Name: John Moss MRN: [id number] Date of Birth: 1986-01-24  Subjective/Objective:                   Right L5-S1 herniated nucleus pulposus Action/Plan:  Discharge  planning Expected Discharge Date:  09/30/15              Expected Discharge Plan:  Home/Self Care  In-House Referral:     Discharge planning Services  CM Consult  Post Acute Care Choice:  Durable Medical Equipment Choice offered to:     DME Arranged:  Walker rolling DME Agency:  [facility]  HH Arranged:    [facility] Agency:     Status of Service:  Completed, signed off  Medicare Important Message Given:    Date Medicare IM Given:    Medicare IM give by:    Date Additional Medicare IM Given:    Additional Medicare Important Message give by:     If discussed at Long Length of Stay Meetings, dates discussed:    Additional Comments: Cm received call from RN requesting a rolling walker.  Pt has no insurance and though HHPT has been recc and ordered, pt is looked at as a Meidcaid and per Medicaid criteria, pt does not meet for HHPT.  Cm called [facility] rep, tiffany for a charity rolling walker and to confirm non coverage for HHPT.  Out of pocket expense is approx 150.00 per visit and does not wish to pay out of pocket. Yves Dill, RN 09/30/2015, 2:25 PM

## 2015-09-30 NOTE — Evaluation (Signed)
Physical Therapy Evaluation Patient Details Name: John Moss MRN: [id number] DOB: 05-04-1986 Today's Date: 09/30/2015   History of Present Illness  29 yo male with L5-S1 HNP and received microdiscectomy and laminectomy  Clinical Impression  Pt was able to be mobilized with good tolerance for movement, mild discomfort and will need Spanish instructions for back which nursing reports will print on his discharge summary.  Translation services went well and pt reports no questions for now.  HHPT to follow up with LE strengthening and to ensure a safe transition home.    Follow Up Recommendations Home health PT;Supervision/Assistance - 24 hour    Equipment Recommendations  Rolling walker with 5" wheels    Recommendations for Other Services       Precautions / Restrictions Precautions Precautions: Back;Fall Precaution Booklet Issued: No Precaution Comments: reviewed but did not have Spanish instructions (Nursing aware and looking for them in [facility] system) Required Braces or Orthoses:  (None) Restrictions Weight Bearing Restrictions: No      Mobility  Bed Mobility Overal bed mobility: Needs Assistance Bed Mobility: Rolling;Sidelying to Sit Rolling: Supervision;Min guard Sidelying to sit: Min guard;Min assist       General bed mobility comments: assisted under trunk when in position   Transfers Overall transfer level: Needs assistance Equipment used: Rolling walker (2 wheeled);1 person hand held assist Transfers: Sit to/from [employer] Sit to Stand: Min guard Stand pivot transfers: Min guard          Ambulation/Gait Ambulation/Gait assistance: Min guard Ambulation Distance (Feet): 120 Feet Assistive device: Rolling walker (2 wheeled);1 person hand held assist Gait Pattern/deviations: Step-through pattern;Decreased dorsiflexion - right;Decreased dorsiflexion - left;Wide base of support;Trunk flexed Gait velocity: reduced Gait velocity  interpretation: Below normal speed for age/gender General Gait Details: prompts for directing walker and safe maneuvering  Stairs Stairs: Yes Stairs assistance: Min guard Stair Management: Two rails Number of Stairs: 10 General stair comments: Pt uses railing as a steadying tool but did not lean on it  Wheelchair Mobility    Modified Rankin (Stroke Patients Only)       Balance Overall balance assessment: Needs assistance Sitting-balance support: Feet supported Sitting balance-Leahy Scale: Good   Postural control: Posterior lean Standing balance support: Bilateral upper extremity supported Standing balance-Leahy Scale: Fair                               Pertinent Vitals/Pain Pain Assessment: Faces Faces Pain Scale: Hurts a little bit Pain Location: back Pain Intervention(s): Limited activity within patient's tolerance;Monitored during session;Premedicated before session;Repositioned;Other (comment) (only with gait but not prior tothis)    Home Living Family/patient expects to be discharged to:: Private residence Living Arrangements: Spouse/significant other Available Help at Discharge: Family;Available 24 hours/day Type of Home: House Home Access: Stairs to enter Entrance Stairs-Rails: Left Entrance Stairs-Number of Steps: 6 Home Layout: One level Home Equipment: None Additional Comments: Has been independent and not using any equipment    Prior Function Level of Independence: Independent               Hand Dominance        Extremity/Trunk Assessment   Upper Extremity Assessment: Overall WFL for tasks assessed           Lower Extremity Assessment: Overall WFL for tasks assessed      Cervical / Trunk Assessment: Normal  Communication   Communication: Prefers language other than [place];Interpreter utilized;Other (comment) (Translated eval questions)  Cognition Arousal/Alertness: Awake/alert Behavior During Therapy: WFL for tasks  assessed/performed Overall Cognitive Status: Within Functional Limits for tasks assessed                      General Comments General comments (skin integrity, edema, etc.): Has clean bandage on back and no obvious skin changes around it.  Supported posture with pillows in chair to control extension.    Exercises        Assessment/Plan    PT Assessment Patient needs continued PT services  PT Diagnosis Acute pain   PT Problem List Decreased activity tolerance;Decreased balance;Decreased mobility;Decreased skin integrity;Pain  PT Treatment Interventions DME instruction;Gait training;Stair training;Functional mobility training;Therapeutic activities;Therapeutic exercise;Balance training;Neuromuscular re-education;Patient/family education   PT Goals (Current goals can be found in the Care Plan section) Acute Rehab PT Goals Patient Stated Goal: none stated PT Goal Formulation: With patient Time For Goal Achievement: 10/14/15 Potential to Achieve Goals: Good    Frequency Min 3X/week   Barriers to discharge Inaccessible home environment (Stairs to enter ) assistance with wife    Co-evaluation               End of Session   Activity Tolerance: Patient tolerated treatment well;Patient limited by fatigue Patient left: in chair;with call bell/phone within reach;with family/visitor present Nurse Communication: Mobility status;Other (comment) (asked for help locating Spanish instructions)    Functional Assessment Tool Used: clinical judgment Functional Limitation: Mobility: Walking and moving around Mobility: Walking and Moving Around Current Status [phone]): At least 20 percent but less than 40 percent impaired, limited or restricted Mobility: Walking and Moving Around Goal Status [phone]): At least 20 percent but less than 40 percent impaired, limited or restricted    Time: 0927-0955 PT Time Calculation (min) (ACUTE ONLY): 28 min   Charges:   PT  Evaluation $Initial PT Evaluation Tier I: 1 Procedure PT Treatments $Gait Training: 8-22 mins   PT G Codes:   PT G-Codes **NOT FOR INPATIENT CLASS** Functional Assessment Tool Used: clinical judgment Functional Limitation: Mobility: Walking and moving around Mobility: Walking and Moving Around Current Status ([id number]): At least 20 percent but less than 40 percent impaired, limited or restricted Mobility: Walking and Moving Around Goal Status [phone]): At least 20 percent but less than 40 percent impaired, limited or restricted    Ivar Drape 09/30/2015, 11:00 AM  Samul Dada, PT MS Acute Rehab Dept. Number: [facility] [id number] and [facility] [phone]

## 2015-10-01 ENCOUNTER — Encounter (HOSPITAL_COMMUNITY): Payer: Self-pay | Admitting: Specialist

## 2015-10-03 MED FILL — Thrombin For Soln 20000 Unit: CUTANEOUS | Qty: 1 | Status: AC

## 2015-10-26 NOTE — Discharge Summary (Signed)
Physician Discharge Summary      Patient ID: John Moss MRN: [id number] DOB/AGE: [birth year] 29 y.o.  Admit date: 09/29/2015 Discharge date: 09/30/2015  Admission Diagnoses:  Principal Problem:   HNP (herniated nucleus pulposus), lumbar Active Problems:   Herniated nucleus pulposus, lumbar   Discharge Diagnoses:  Same  Past Medical History  Diagnosis Date  . Medical history non-contributory     Surgeries: Procedure(s): RIGHT L5-S1 MICRODISCECTOMY  on 09/29/2015   Consultants:    Discharged Condition: Improved  Hospital Course: John Moss is an 29 y.o. male who was admitted 09/29/2015 with a chief complaint of No chief complaint on file. , and found to have a diagnosis of HNP (herniated nucleus pulposus), lumbar.  He was brought to the operating room on 09/29/2015 and underwent the above named procedures.    He was given perioperative antibiotics:  Anti-infectives    Start     Dose/Rate Route Frequency Ordered Stop   09/30/15 0600  ceFAZolin (ANCEF) IVPB 2 g/50 mL premix     2 g 100 mL/hr over 30 Minutes Intravenous On call to O.R. 09/29/15 1327 09/29/15 1720   09/29/15 2130  ceFAZolin (ANCEF) IVPB 1 g/50 mL premix     1 g 100 mL/hr over 30 Minutes Intravenous Every 8 hours 09/29/15 2123 09/30/15 0655   09/29/15 1333  ceFAZolin (ANCEF) 2-3 GM-% IVPB SOLR    Comments:  Scronce, Trina   : cabinet override      09/29/15 1333 09/30/15 0144    POD#1 tolerating po medications and nourishment. Ambulated in hallway without assistance. Incision without Drainage. Voiding without difficulty. Discharged home on POD#1.  He was given sequential compression devices, early ambulation, and chemoprophylaxis for DVT prophylaxis.  He benefited maximally from their hospital stay and there were no complications.    Recent vital signs:  Filed Vitals:   09/30/15 0534  BP: 106/59  Pulse: 77  Temp: 98.4 F (36.9 C)  Resp: 16    Recent laboratory studies:  Results for  orders placed or performed during the hospital encounter of 09/29/15  Surgical pcr screen  Result Value Ref Range   MRSA, PCR NEGATIVE NEGATIVE   Staphylococcus aureus NEGATIVE NEGATIVE  Comprehensive metabolic panel  Result Value Ref Range   Sodium 141 135 - 145 mmol/L   Potassium 4.0 3.5 - 5.1 mmol/L   Chloride 107 101 - 111 mmol/L   CO2 22 22 - 32 mmol/L   Glucose, Bld 90 65 - 99 mg/dL   BUN 10 6 - 20 mg/dL   Creatinine, Ser 4.09 0.61 - 1.24 mg/dL   Calcium 9.9 8.9 - 81.1 mg/dL   Total Protein 7.6 6.5 - 8.1 g/dL   Albumin 4.4 3.5 - 5.0 g/dL   AST 26 15 - 41 U/L   ALT 28 17 - 63 U/L   Alkaline Phosphatase 65 38 - 126 U/L   Total Bilirubin 0.7 0.3 - 1.2 mg/dL   GFR calc non Af Amer >60 >60 mL/min   GFR calc Af Amer >60 >60 mL/min   Anion gap 12 5 - 15  CBC  Result Value Ref Range   WBC 10.9 (H) 4.0 - 10.5 K/uL   RBC 5.80 4.22 - 5.81 MIL/uL   Hemoglobin 16.6 13.0 - 17.0 g/dL   HCT 91.4 78.2 - 95.6 %   MCV 81.6 78.0 - 100.0 fL   MCH 28.6 26.0 - 34.0 pg   MCHC 35.1 30.0 - 36.0 g/dL   RDW 21.3 08.6 - 57.8 %  Platelets 330 150 - 400 K/uL    Discharge Medications:     Medication List    TAKE these medications        acetaminophen 650 MG CR tablet  Commonly known as:  TYLENOL  Take 1,300 mg by mouth every 8 (eight) hours as needed for pain.     cyclobenzaprine 10 MG tablet  Commonly known as:  FLEXERIL  Take 1 tablet (10 mg total) by mouth daily as needed for muscle spasms.     HYDROcodone-acetaminophen 5-325 MG tablet  Commonly known as:  NORCO  Take 1-2 tablets by mouth every 6 (six) hours as needed for moderate pain. MAXIMUM TOTAL ACETAMINOPHEN DOSE IS 4000 MG PER DAY        Diagnostic Studies: Dg Lumbar Spine 2-3 Views  09/29/2015  CLINICAL DATA:  L5-S1 laminectomy EXAM: LUMBAR SPINE - 2-3 VIEW COMPARISON:  Lumbar MRI 09/20/2015 FINDINGS: Three lateral images of the lumbar spine were obtained. The lowest disc space is L5-S1 consistent with MRI numbering.  Image number 1 reveals a needle posterior to the S2 vertebral body over the canal Image number 2 reveals a needle overlying the L5 spinous process directed at the L5-S1 disc space Image number 3 reveals a surgical instrument advanced under the lamina of L5 with the tip over the spinal canal below the L5-S1 disc space. IMPRESSION: L5-S1 localized. Electronically Signed   By: Marlan Palau M.D.   On: 09/29/2015 19:59    Disposition: 01-Home or Self Care      Discharge Instructions    Call MD / Call 911    Complete by:  As directed   If you experience chest pain or shortness of breath, CALL 911 and be transported to the hospital emergency room.  If you develope a fever above 101 F, pus (white drainage) or increased drainage or redness at the wound, or calf pain, call your surgeon's office.     Call MD / Call 911    Complete by:  As directed   If you experience chest pain or shortness of breath, CALL 911 and be transported to the hospital emergency room.  If you develope a fever above 101 F, pus (white drainage) or increased drainage or redness at the wound, or calf pain, call your surgeon's office.     Constipation Prevention    Complete by:  As directed   Drink plenty of fluids.  Prune juice may be helpful.  You may use a stool softener, such as Colace (over the counter) 100 mg twice a day.  Use MiraLax (over the counter) for constipation as needed.     Constipation Prevention    Complete by:  As directed   Drink plenty of fluids.  Prune juice may be helpful.  You may use a stool softener, such as Colace (over the counter) 100 mg twice a day.  Use MiraLax (over the counter) for constipation as needed.     Diet - low sodium heart healthy    Complete by:  As directed      Diet - low sodium heart healthy    Complete by:  As directed      Discharge instructions    Complete by:  As directed   No lifting greater than 10 lbs. Avoid bending, stooping and twisting. Walk in house for first week them  may start to get out slowly increasing distance up to one mile by 3 weeks post op. Keep incision dry for 3 days, may use  tegaderm or similar water impervious dressing.     Driving restrictions    Complete by:  As directed   No driving for 3 weeks     Increase activity slowly as tolerated    Complete by:  As directed      Increase activity slowly as tolerated    Complete by:  As directed      Lifting restrictions    Complete by:  As directed   No lifting for 8 weeks           Follow-up Information    Follow up with NITKA,JAMES E, MD In 2 weeks.   Specialty:  Orthopedic Surgery   Contact information:   [address] [place] [address] [phone]       Follow up with [facility].   Why:  rolling walker   Contact information:   [address] [place] [address] [phone]        Signed: Kerrin ChampagneITKA,JAMES E 10/26/2015, 11:37 PM

## 2015-11-29 ENCOUNTER — Ambulatory Visit: Payer: Self-pay | Attending: Specialist | Admitting: Physical Therapy

## 2015-11-29 DIAGNOSIS — M25669 Stiffness of unspecified knee, not elsewhere classified: Secondary | ICD-10-CM

## 2015-11-29 DIAGNOSIS — M2569 Stiffness of other specified joint, not elsewhere classified: Secondary | ICD-10-CM

## 2015-11-29 DIAGNOSIS — M2560 Stiffness of unspecified joint, not elsewhere classified: Secondary | ICD-10-CM

## 2015-11-29 DIAGNOSIS — M6281 Muscle weakness (generalized): Secondary | ICD-10-CM | POA: Insufficient documentation

## 2015-11-29 DIAGNOSIS — R29898 Other symptoms and signs involving the musculoskeletal system: Secondary | ICD-10-CM | POA: Insufficient documentation

## 2015-11-29 NOTE — Patient Instructions (Signed)
Bilateral active hamstring stretch. (Declined handout) 5 reps - 20 second hold

## 2015-11-29 NOTE — Therapy (Signed)
[facility] [address] [place], [place], [address] Phone: [phone]   Fax:  [phone]  Physical Therapy Evaluation  Patient Details  Name: John Moss MRN: [id number] Date of Birth: [birth year] Referring Provider: Vira Browns  Encounter Date: 11/29/2015      PT End of Session - 11/29/15 1315    Visit Number 1   Number of Visits 12   Date for PT Re-Evaluation 12/29/15   PT Start Time 0846   PT Stop Time 0930   PT Time Calculation (min) 44 min   Activity Tolerance Patient tolerated treatment well   Behavior During Therapy [facility] for tasks assessed/performed      Past Medical History  Diagnosis Date  . Medical history non-contributory     Past Surgical History  Procedure Laterality Date  . No past surgeries    . Lumbar laminectomy N/A 09/29/2015    Procedure: RIGHT L5-S1 MICRODISCECTOMY ;  Surgeon: Kerrin Champagne, MD;  Location: [facility] OR;  Service: Orthopedics;  Laterality: N/A;    There were no vitals filed for this visit.  Visit Diagnosis:  Decreased range of motion of lower extremity - Plan: PT plan of care cert/re-cert  Decreased range of motion of trunk and back - Plan: PT plan of care cert/re-cert  Decreased strength of trunk and back - Plan: PT plan of care cert/re-cert  Muscle weakness of lower extremity - Plan: PT plan of care cert/re-cert      Subjective Assessment - 11/29/15 1256    Subjective Pain started in May 2016 and going down the leg. Then is got worse and went to a chiropractor for 2 months. Ultimately went to hospital for further evaluation and had a L5-S1 microdiscectomy in early October 2016. Pain has gotten much better but is still in the Rt LE.    Patient is accompained by: Interpreter   Limitations Walking   How long can you sit comfortably? 3 hours   How long can you stand comfortably? 2-3 hours   How long can you walk comfortably? variable, bad days 2-3 hours, good days 4 hours   Patient Stated Goals Get back to moving without pain and being active, return to work again.             [facility] PT Assessment - 11/29/15 0001    Assessment   Medical Diagnosis L5-S1 microdiscectomy   Referring Provider Vira Browns   Onset Date/Surgical Date 09/29/15   Next MD Visit scheduled but unsure of date   Prior Therapy none   Balance Screen   Has the patient fallen in the past 6 months No   Home Environment   Living Environment Private residence   Prior Function   Level of Independence Independent   Cognition   Overall Cognitive Status Within Functional Limits for tasks assessed   Observation/Other Assessments   Observations guarded motion with change in position   Sensation   Light Touch Appears Intact   AROM   Lumbar Flexion unable to reach to knee height  reports feeling tight in back and hamstrings   Lumbar Extension [facility]   Lumbar - Right Side Bend [facility]   Lumbar - Left Side Bend WFL   Lumbar - Right Rotation [facility]   Lumbar - Left Rotation [facility]   Strength   Overall Strength Other (comment)  fair abdominal activation with cues   Overall Strength Comments LLE myotomes 5/5 throughout   Right Hip Flexion 4/5   Right Knee Flexion 4+/5  Right Knee Extension 4/5   Right Ankle Dorsiflexion 4/5   Right Ankle Plantar Flexion 4+/5   Flexibility   Hamstrings restrictions in length bilaterally Rt more limited that Lt - tested with SLR and active stretch position                   [facility] Adult PT Treatment/Exercise - 11/29/15 0001    Exercises   Exercises Lumbar;Knee/Hip   Lumbar Exercises: Stretches   Active Hamstring Stretch 5 reps;20 seconds   Active Hamstring Stretch Limitations educated on technique, caution to avoid radicular pain, stopping if occuring.                 PT Education - 11/29/15 1313    Education provided Yes   Education Details educated on hamstring stretch and overall plan of PT sessions   Person(s) Educated Patient   Methods  Explanation   Comprehension Verbalized understanding;Returned demonstration          PT Short Term Goals - 11/29/15 1324    PT SHORT TERM GOAL #1   Title Patient to be independent with HEP for core stabilization and flexibility.    Time 3   Period Weeks   Status New   PT SHORT TERM GOAL #2   Title Patient to demonstrate proper body mechanics for lifting techniques.    Time 3   Period Weeks   Status New           PT Long Term Goals - 11/29/15 1327    PT LONG TERM GOAL #1   Title Patient to demonstrate bilateral hamstring flexibility to 10 degrees short of extension in active stretch position for bending tasks.    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    Time 6   Period Weeks   Status New   PT LONG TERM GOAL #3   Title Patient to demonstrate improved trunk flexion to within 6 inches of finger to floor for ADLs.   Time 6   Period Weeks   Status New               Plan - 11/29/15 1317    Clinical Impression Statement Patient presenting now s/p microdiscectomy on 09/29/15. Patient states that he is making some gradual progress but is still having trouble with pain in the back of his Rt leg. Additionally he is limited with his activity tolerance and has not been fully released back to work per his report. Upon completion of the initial evaluation it is noted that the patient has significant restreictions in bilateral hamstrings with rt more restricted than Lt . Additionally he has related limitations in trunk flexion as well as decreased core stabiltiy. The patient is appropriate for further PT sessions to address these areas for improved overall functional tolerance.    Pt will benefit from skilled therapeutic intervention in order to improve on the following deficits Decreased activity tolerance;Impaired flexibility;Improper body mechanics;Decreased range of  motion;Decreased mobility;Difficulty walking;Pain;Decreased strength   Rehab Potential Good   PT Frequency 2x / week   PT Duration 6 weeks   PT Treatment/Interventions ADLs/Self Care Home Management;Electrical Stimulation;Ultrasound;Gait training;Stair training;Therapeutic activities;Therapeutic exercise;Manual techniques;Patient/family education;Passive range of motion;Dry needling   PT Next Visit Plan Check and advance hamstring musculature, progress core strength as well, progress HEP.    PT Home Exercise Plan core strengthening,  hamstring flexibility   Consulted and Agree with Plan of Care Patient         Problem List Patient Active Problem List   Diagnosis Date Noted  . HNP (herniated nucleus pulposus), lumbar 09/29/2015    Class: Acute  . Herniated nucleus pulposus, lumbar 09/29/2015    Delton SeeBenjamin Dwyne Hasegawa, PT  11/29/2015, 1:38 PM  [facility] [address] [place], [place], [address] Phone: [phone]   Fax:  [phone]  Name: John Moss MRN: [id number] Date of Birth: 06/06/1986

## 2015-12-04 ENCOUNTER — Ambulatory Visit: Payer: Self-pay | Attending: Family Medicine

## 2015-12-06 ENCOUNTER — Ambulatory Visit: Payer: Self-pay | Attending: Specialist | Admitting: Physical Therapy

## 2015-12-06 DIAGNOSIS — M2560 Stiffness of unspecified joint, not elsewhere classified: Secondary | ICD-10-CM

## 2015-12-06 DIAGNOSIS — M2569 Stiffness of other specified joint, not elsewhere classified: Secondary | ICD-10-CM

## 2015-12-06 DIAGNOSIS — M6281 Muscle weakness (generalized): Secondary | ICD-10-CM | POA: Insufficient documentation

## 2015-12-06 DIAGNOSIS — R29898 Other symptoms and signs involving the musculoskeletal system: Secondary | ICD-10-CM | POA: Insufficient documentation

## 2015-12-06 DIAGNOSIS — M25669 Stiffness of unspecified knee, not elsewhere classified: Secondary | ICD-10-CM

## 2015-12-06 NOTE — Therapy (Signed)
[facility] [address] [place], [place], [address] Phone: [phone]   Fax:  [phone]  Physical Therapy Treatment  Patient Details  Name: John Moss MRN: [id number] Date of Birth: 1986-01-09 Referring Provider: Vira Browns  Encounter Date: 12/06/2015      PT End of Session - 12/06/15 1319    Visit Number 2   Number of Visits 12   Date for PT Re-Evaluation 12/29/15   PT Start Time 0847   PT Stop Time 0932   PT Time Calculation (min) 45 min   Activity Tolerance Patient tolerated treatment well   Behavior During Therapy [facility] for tasks assessed/performed      Past Medical History  Diagnosis Date  . Medical history non-contributory     Past Surgical History  Procedure Laterality Date  . No past surgeries    . Lumbar laminectomy N/A 09/29/2015    Procedure: RIGHT L5-S1 MICRODISCECTOMY ;  Surgeon: Kerrin Champagne, MD;  Location: [facility] OR;  Service: Orthopedics;  Laterality: N/A;    There were no vitals filed for this visit.  Visit Diagnosis:  Decreased range of motion of lower extremity  Decreased range of motion of trunk and back  Decreased strength of trunk and back  Muscle weakness of lower extremity      Subjective Assessment - 12/06/15 0852    Subjective Doing a little better, the stretches have seemed to help.    Currently in Pain? Yes   Pain Score 1    Pain Location Leg   Pain Orientation Right;Posterior   Pain Descriptors / Indicators Tightness   Aggravating Factors  getting up in the morning,    Pain Relieving Factors getting up and moving                         [facility] Adult PT Treatment/Exercise - 12/06/15 0001    Lumbar Exercises: Stretches   Passive Hamstring Stretch 30 seconds;3 reps   Passive Hamstring Stretch Limitations Rt more restricted than Lt   Single Knee to Chest Stretch 3 reps;30 seconds  bilateral   Lumbar Exercises: Supine   Ab Set 5 seconds;15 reps   Bent  Knee Raise 10 reps;Other (comment)  bilateral   Bent Knee Raise Limitations cues for core stabilization   Other Supine Lumbar Exercises bent knee raise into knee extension with pelvic tilt, bilateral 1X10, verbal and tactile cues for maintaining neutral spine.    Knee/Hip Exercises: Aerobic   Nustep L4 X5 min                PT Education - 12/06/15 1318    Education provided Yes   Education Details relation of core activation and decreased back pain   Person(s) Educated Patient   Methods Explanation;Demonstration;Verbal cues;Tactile cues   Comprehension Verbalized understanding;Returned demonstration          PT Short Term Goals - 11/29/15 1324    PT SHORT TERM GOAL #1   Title Patient to be independent with HEP for core stabilization and flexibility.    Time 3   Period Weeks   Status New   PT SHORT TERM GOAL #2   Title Patient to demonstrate proper body mechanics for lifting techniques.    Time 3   Period Weeks   Status New           PT Long Term Goals - 11/29/15 1327    PT LONG TERM GOAL #1   Title Patient  to demonstrate bilateral hamstring flexibility to 10 degrees short of extension in active stretch position for bending tasks.    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    Time 6   Period Weeks   Status New   PT LONG TERM GOAL #3   Title Patient to demonstrate improved trunk flexion to within 6 inches of finger to floor for ADLs.   Time 6   Period Weeks   Status New               Plan - 12/06/15 1319    Clinical Impression Statement Patient reporting gradual improvement with HEP. Session focused on establishment and progression of HEP with flexibility and core stabilization exercises. Patient with decreased pain when pre-setting abdominals before lifting legs. Will contiue to incorporate flexibility and stabilization exercises during following  sessions with an emphasis on HEP.    PT Next Visit Plan Check HEP, progress to dead bug if appropriate, continue with flexibility component as well.    PT Home Exercise Plan core strengthening, hamstring flexibility   Consulted and Agree with Plan of Care Patient        Problem List Patient Active Problem List   Diagnosis Date Noted  . HNP (herniated nucleus pulposus), lumbar 09/29/2015    Class: Acute  . Herniated nucleus pulposus, lumbar 09/29/2015    Delton SeeBenjamin [place] Ice, PT 12/06/2015, 1:23 PM  [facility] [address] [place], [place], [address] Phone: [phone]   Fax:  [phone]  Name: John Moss MRN: [id number] Date of Birth: 07/20/1986

## 2015-12-08 ENCOUNTER — Ambulatory Visit: Payer: Self-pay | Admitting: Physical Therapy

## 2015-12-08 DIAGNOSIS — R29898 Other symptoms and signs involving the musculoskeletal system: Secondary | ICD-10-CM

## 2015-12-08 DIAGNOSIS — M2560 Stiffness of unspecified joint, not elsewhere classified: Secondary | ICD-10-CM

## 2015-12-08 DIAGNOSIS — M6281 Muscle weakness (generalized): Secondary | ICD-10-CM

## 2015-12-08 DIAGNOSIS — M25669 Stiffness of unspecified knee, not elsewhere classified: Secondary | ICD-10-CM

## 2015-12-08 DIAGNOSIS — M2569 Stiffness of other specified joint, not elsewhere classified: Secondary | ICD-10-CM

## 2015-12-08 NOTE — Therapy (Signed)
[facility] [address] [place], [place], [address] Phone: [phone]   Fax:  [phone]  Physical Therapy Treatment  Patient Details  Name: John Moss MRN: [id number] Date of Birth: 02/14/1986 Referring Provider: Vira BrownsJames Nitka  Encounter Date: 12/08/2015      PT End of Session - 12/08/15 1030    Visit Number 3   Number of Visits 12   Date for PT Re-Evaluation 12/29/15   PT Start Time 0843   PT Stop Time 0813   PT Time Calculation (min) 1410 min   Activity Tolerance Patient tolerated treatment well;No increased pain   Behavior During Therapy [facility] for tasks assessed/performed      Past Medical History  Diagnosis Date  . Medical history non-contributory     Past Surgical History  Procedure Laterality Date  . No past surgeries    . Lumbar laminectomy N/A 09/29/2015    Procedure: RIGHT L5-S1 MICRODISCECTOMY ;  Surgeon: Kerrin ChampagneJames E Nitka, MD;  Location: [facility] OR;  Service: Orthopedics;  Laterality: N/A;    There were no vitals filed for this visit.  Visit Diagnosis:  Decreased range of motion of lower extremity  Decreased range of motion of trunk and back  Decreased strength of trunk and back  Muscle weakness of lower extremity      Subjective Assessment - 12/08/15 0848    Subjective Having a little pain right now, seems worse when going out in the cold. When I do the exercises they help the pain get better.    Currently in Pain? Yes   Pain Score 2    Pain Location Leg   Pain Orientation Right   Pain Descriptors / Indicators Tightness                         [facility] Adult PT Treatment/Exercise - 12/08/15 0001    Lumbar Exercises: Stretches   Passive Hamstring Stretch 30 seconds;3 reps   Passive Hamstring Stretch Limitations Rt more restricted than Lt   Single Knee to Chest Stretch 3 reps;30 seconds   Lower Trunk Rotation 5 reps;10 seconds;Other (comment)  bilateral   Lumbar Exercises:  Standing   Other Standing Lumbar Exercises band rows 1X10 blue band, cues for core activation   Other Standing Lumbar Exercises shld extension with abdominal activation blue band 1 X10   Lumbar Exercises: Supine   Ab Set 5 seconds;15 reps   Bent Knee Raise 10 reps;Other (comment)  bilateral   Bent Knee Raise Limitations cues for core stabilization   Other Supine Lumbar Exercises 55 cm ball flexion stretch   Other Supine Lumbar Exercises Tball arm roll out stretch(modified childs pose) with modification into rotations bilaterally, 10 second holds   Knee/Hip Exercises: Aerobic   Nustep L4 X6 min   Knee/Hip Exercises: Supine   Other Supine Knee/Hip Exercises dead bug 1X10, 3 lb hand weights                PT Education - 12/08/15 1021    Education provided Yes   Education Details stretching, posture and core strength with ADLs, HEP progression   Person(s) Educated Patient   Methods Explanation;Demonstration;Tactile cues;Verbal cues;Handout   Comprehension Verbalized understanding;Returned demonstration          PT Short Term Goals - 11/29/15 1324    PT SHORT TERM GOAL #1   Title Patient to be independent with HEP for core stabilization and flexibility.    Time 3   Period  Weeks   Status New   PT SHORT TERM GOAL #2   Title Patient to demonstrate proper body mechanics for lifting techniques.    Time 3   Period Weeks   Status New           PT Long Term Goals - 11/29/15 1327    PT LONG TERM GOAL #1   Title Patient to demonstrate bilateral hamstring flexibility to 10 degrees short of extension in active stretch position for bending tasks.    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    Time 6   Period Weeks   Status New   PT LONG TERM GOAL #3   Title Patient to demonstrate improved trunk flexion to within 6 inches of finger to floor for ADLs.   Time 6   Period  Weeks   Status New               Plan - 12/08/15 1103    Clinical Impression Statement Patient making gradual progress with PT interventions. Able to progress stengthening and stretching. Currently progressing into seated and standing exercises for more functional positions. Patient reports that he can tell he is making progress. It was observed during the session that the patient was mainatining a lateral shift to the left whidh he states was present prior to surgery but it has imporved. Noted increased muscle tension and tenderness along lt thoracic paraspinals.    PT Next Visit Plan Check HEP, progress with seated and standing exercises as tolerated. patient looking into getting T-ball for home. Add t-ball stretches and stabilization as appropriate.   PT Home Exercise Plan seated and standing core exercises for more functional positions, start t-ball stretches if patient decides to purchase.    Consulted and Agree with Plan of Care Patient        Problem List Patient Active Problem List   Diagnosis Date Noted  . HNP (herniated nucleus pulposus), lumbar 09/29/2015    Class: Acute  . Herniated nucleus pulposus, lumbar 09/29/2015    Delton See, PT 12/08/2015, 11:09 AM  [facility] [address] [place], [place], [address] Phone: [phone]   Fax:  [phone]  Name: John Moss MRN: [id number] Date of Birth: 1986-03-01

## 2015-12-13 ENCOUNTER — Ambulatory Visit: Payer: Self-pay | Admitting: Physical Therapy

## 2015-12-13 DIAGNOSIS — M2560 Stiffness of unspecified joint, not elsewhere classified: Secondary | ICD-10-CM

## 2015-12-13 DIAGNOSIS — M2569 Stiffness of other specified joint, not elsewhere classified: Secondary | ICD-10-CM

## 2015-12-13 DIAGNOSIS — R29898 Other symptoms and signs involving the musculoskeletal system: Secondary | ICD-10-CM

## 2015-12-13 DIAGNOSIS — M6281 Muscle weakness (generalized): Secondary | ICD-10-CM

## 2015-12-13 DIAGNOSIS — M25669 Stiffness of unspecified knee, not elsewhere classified: Secondary | ICD-10-CM

## 2015-12-13 NOTE — Therapy (Signed)
[facility] [address] [place], [place], [address] Phone: [phone]   Fax:  [phone]  Physical Therapy Treatment  Patient Details  Name: John Moss MRN: [id number] Date of Birth: Oct 22, 1986 Referring Provider: Vira Browns  Encounter Date: 12/13/2015      PT End of Session - 12/13/15 1449    Visit Number 4   Number of Visits 12   Date for PT Re-Evaluation 12/29/15   PT Start Time 0847   PT Stop Time 0933   PT Time Calculation (min) 46 min   Activity Tolerance Patient tolerated treatment well;No increased pain   Behavior During Therapy [facility] for tasks assessed/performed      Past Medical History  Diagnosis Date  . Medical history non-contributory     Past Surgical History  Procedure Laterality Date  . No past surgeries    . Lumbar laminectomy N/A 09/29/2015    Procedure: RIGHT L5-S1 MICRODISCECTOMY ;  Surgeon: Kerrin Champagne, MD;  Location: [facility] OR;  Service: Orthopedics;  Laterality: N/A;    There were no vitals filed for this visit.  Visit Diagnosis:  Decreased range of motion of lower extremity  Decreased range of motion of trunk and back  Decreased strength of trunk and back  Muscle weakness of lower extremity      Subjective Assessment - 12/13/15 0849    Subjective I think I have irritated the nerve in the right leg a little, feeling a little more in the back of the knee and on the outside/bottom of the foot. Did go get a ball to use at home too.    Currently in Pain? Yes   Pain Score 2    Pain Location Leg   Pain Orientation Right   Pain Descriptors / Indicators --  muscle and a little nerve like pain   Aggravating Factors  getting up in the morning   Pain Relieving Factors get moving again                         [facility] Adult PT Treatment/Exercise - 12/13/15 0001    Lumbar Exercises: Standing   Other Standing Lumbar Exercises seated on Tball shoulder flexion 3lbs 1X10  bilaterally   Lumbar Exercises: Seated   Hip Flexion on Ball Strengthening;Both;10 reps   Lumbar Exercises: Supine   Other Supine Lumbar Exercises 55 cm ball flexion stretch   Other Supine Lumbar Exercises --   Lumbar Exercises: Sidelying   Clam 10 reps;5 seconds  moderate resistance Rt   Hip Abduction 10 reps;2 seconds  Rt   Knee/Hip Exercises: Stretches   Passive Hamstring Stretch Right;3 reps;30 seconds;Other (comment)  with strap   Knee/Hip Exercises: Aerobic   Nustep L4 X6 min   Knee/Hip Exercises: Supine   Other Supine Knee/Hip Exercises dead bug 1X10, 3 lb hand weights                  PT Short Term Goals - 11/29/15 1324    PT SHORT TERM GOAL #1   Title Patient to be independent with HEP for core stabilization and flexibility.    Time 3   Period Weeks   Status New   PT SHORT TERM GOAL #2   Title Patient to demonstrate proper body mechanics for lifting techniques.    Time 3   Period Weeks   Status New           PT Long Term Goals - 11/29/15 1327  PT LONG TERM GOAL #1   Title Patient to demonstrate bilateral hamstring flexibility to 10 degrees short of extension in active stretch position for bending tasks.    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    Time 6   Period Weeks   Status New   PT LONG TERM GOAL #3   Title Patient to demonstrate improved trunk flexion to within 6 inches of finger to floor for ADLs.   Time 6   Period Weeks   Status New               Plan - 12/13/15 1450    Clinical Impression Statement Patient reports making gradual progress with PT sessions and feels like he is able to move better and with less pain. At the beginning of the session the patient did report some mild radicular type symptoms down the Rt LE but this was reportedly resolved during the session. Posture was improved also with decreased lateral shift.  Able to progress stabilization program to include Tball exercises for advanced stabilization activities. Will continue to progress as appropriate.    PT Next Visit Plan Check Tball exercieses and strap assisted hamstring stretches. Progress hip abductor strengthening. Review body mechanics with lifting if tolerable.    PT Home Exercise Plan review and modify HEP with t-ball. check hip abduction for technique.    Consulted and Agree with Plan of Care Patient        Problem List Patient Active Problem List   Diagnosis Date Noted  . HNP (herniated nucleus pulposus), lumbar 09/29/2015    Class: Acute  . Herniated nucleus pulposus, lumbar 09/29/2015    John Moss, PT 12/13/2015, 2:56 PM  [facility] [address] [place], [place], [address] Phone: [phone]   Fax:  [phone]  Name: Sula RumpleFrancisco Moss MRN: [id number] Date of Birth: 03/27/1986

## 2015-12-15 ENCOUNTER — Ambulatory Visit: Payer: Self-pay | Admitting: Physical Therapy

## 2015-12-15 DIAGNOSIS — M6281 Muscle weakness (generalized): Secondary | ICD-10-CM

## 2015-12-15 DIAGNOSIS — M2569 Stiffness of other specified joint, not elsewhere classified: Secondary | ICD-10-CM

## 2015-12-15 DIAGNOSIS — M25669 Stiffness of unspecified knee, not elsewhere classified: Secondary | ICD-10-CM

## 2015-12-15 DIAGNOSIS — M2560 Stiffness of unspecified joint, not elsewhere classified: Secondary | ICD-10-CM

## 2015-12-15 DIAGNOSIS — R29898 Other symptoms and signs involving the musculoskeletal system: Secondary | ICD-10-CM

## 2015-12-15 NOTE — Therapy (Signed)
[facility] Outpatient Rehabilitation [facility] [address] [place], [place], [address] Phone: [phone]   Fax:  [phone]  Physical Therapy Treatment  Patient Details  Name: John Moss MRN: [id number] Date of Birth: [birth year] Referring Provider: Vira Browns  Encounter Date: 12/15/2015      PT End of Session - 12/15/15 1210    Visit Number 5   Number of Visits 12   Date for PT Re-Evaluation 12/29/15   PT Start Time 0848   PT Stop Time 0930   PT Time Calculation (min) 42 min   Activity Tolerance Patient tolerated treatment well   Behavior During Therapy [facility] for tasks assessed/performed      Past Medical History  Diagnosis Date  . Medical history non-contributory     Past Surgical History  Procedure Laterality Date  . No past surgeries    . Lumbar laminectomy N/A 09/29/2015    Procedure: RIGHT L5-S1 MICRODISCECTOMY ;  Surgeon: Kerrin Champagne, MD;  Location: [facility] OR;  Service: Orthopedics;  Laterality: N/A;    There were no vitals filed for this visit.  Visit Diagnosis:  Decreased range of motion of lower extremity  Decreased range of motion of trunk and back  Decreased strength of trunk and back  Muscle weakness of lower extremity      Subjective Assessment - 12/15/15 0853    Subjective Overall doing much better. Did have some pain with initially getting out of  bed in the morning in the right gluteal region.    Currently in Pain? Yes   Pain Score 2    Pain Location Calf   Pain Orientation Right   Pain Descriptors / Indicators --  muscle knot   Aggravating Factors  worse in the mornings   Pain Relieving Factors staying active and body gets warm   Multiple Pain Sites Yes   Pain Score 2   Pain Location Back   Pain Orientation Right;Left;Lower   Pain Descriptors / Indicators Tiring   Aggravating Factors  getting up in the moring, cold weather   Pain Relieving Factors getting moving                         [facility]  Adult PT Treatment/Exercise - 12/15/15 0001    Lumbar Exercises: Stretches   Piriformis Stretch 3 reps;30 seconds  knee opposite shoulder   Lumbar Exercises: Supine   Other Supine Lumbar Exercises ball hip flexion 1X10, shoulder flexion 1X10, alternating opposite hip/shoulder 1X10 bilaterally   Knee/Hip Exercises: Stretches   Passive Hamstring Stretch Right;3 reps;30 seconds;Other (comment)  with strap   Knee/Hip Exercises: Aerobic   Nustep L5 X6 min   Knee/Hip Exercises: Standing   Other Standing Knee Exercises step calf stretch 1   Other Standing Knee Exercises x30 seconds;   Knee/Hip Exercises: Sidelying   Clams 2X10 grade 2 band   Manual Therapy   Manual Therapy Myofascial release;Soft tissue mobilization   Manual therapy comments TPR to Rt calf musculature                PT Education - 12/15/15 0945    Education provided Yes   Education Details modified HEP added clams with band and piriformis stretches, declined    Person(s) Educated Patient   Methods Explanation;Demonstration;Tactile cues;Verbal cues   Comprehension Verbalized understanding;Returned demonstration          PT Short Term Goals - 11/29/15 1324    PT SHORT TERM GOAL #1   Title  Patient to be independent with HEP for core stabilization and flexibility.    Time 3   Period Weeks   Status New   PT SHORT TERM GOAL #2   Title Patient to demonstrate proper body mechanics for lifting techniques.    Time 3   Period Weeks   Status New           PT Long Term Goals - 11/29/15 1327    PT LONG TERM GOAL #1   Title Patient to demonstrate bilateral hamstring flexibility to 10 degrees short of extension in active stretch position for bending tasks.    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    Time 6   Period Weeks   Status New   PT LONG TERM GOAL #3   Title Patient to demonstrate improved trunk  flexion to within 6 inches of finger to floor for ADLs.   Time 6   Period Weeks   Status New               Plan - 12/15/15 1212    Clinical Impression Statement Patient states that he is still making progress with PT sessions. He states that he is having less pain and increasing his activity. The patient is still having reports of pain in the Rt LE and low back region. Session are focusing on flexibility and stabiitzation exercises in the clinic and with a HEP. Will continue to progress strengtheing as tolerated for incrased activity tolerance both with home and vocational requirements when released back to work.    PT Next Visit Plan Cont with flexibiity and stabilization exercieses. Include t-ball and possible progression to standing activites with core stability for incresed functional positions.    PT Home Exercise Plan review and modify HEP with t-ball. check hip abduction for technique.    Consulted and Agree with Plan of Care Patient        Problem List Patient Active Problem List   Diagnosis Date Noted  . HNP (herniated nucleus pulposus), lumbar 09/29/2015    Class: Acute  . Herniated nucleus pulposus, lumbar 09/29/2015    John Moss, PT 12/15/2015, 12:17 PM  [facility] [address] [place], [place], [address] Phone: [phone]   Fax:  [phone]  Name: Sula RumpleFrancisco Moss MRN: [id number] Date of Birth: 11/21/1986

## 2015-12-20 ENCOUNTER — Ambulatory Visit: Payer: Self-pay | Admitting: Physical Therapy

## 2015-12-20 DIAGNOSIS — M6281 Muscle weakness (generalized): Secondary | ICD-10-CM

## 2015-12-20 DIAGNOSIS — M2569 Stiffness of other specified joint, not elsewhere classified: Secondary | ICD-10-CM

## 2015-12-20 DIAGNOSIS — M2560 Stiffness of unspecified joint, not elsewhere classified: Secondary | ICD-10-CM

## 2015-12-20 DIAGNOSIS — M25669 Stiffness of unspecified knee, not elsewhere classified: Secondary | ICD-10-CM

## 2015-12-20 DIAGNOSIS — R29898 Other symptoms and signs involving the musculoskeletal system: Secondary | ICD-10-CM

## 2015-12-20 NOTE — Therapy (Addendum)
[place], [place], [address] Phone: [phone]   Fax:  [phone]  Physical Therapy Treatment  Patient Details  Name: Rayson Rando MRN: [id number] Date of Birth: 05-30-1986 Referring Provider: Basil Dess  Encounter Date: 12/20/2015      PT End of Session - 12/20/15 1154    Visit Number 6   Number of Visits 12   Date for PT Re-Evaluation 12/29/15   PT Start Time 1100   PT Stop Time 1145   PT Time Calculation (min) 45 min   Activity Tolerance Patient tolerated treatment well   Behavior During Therapy [facility] for tasks assessed/performed      Past Medical History  Diagnosis Date  . Medical history non-contributory     Past Surgical History  Procedure Laterality Date  . No past surgeries    . Lumbar laminectomy N/A 09/29/2015    Procedure: RIGHT L5-S1 MICRODISCECTOMY ;  Surgeon: Jessy Oto, MD;  Location: [place];  Service: Orthopedics;  Laterality: N/A;    There were no vitals filed for this visit.  Visit Diagnosis:  Decreased range of motion of lower extremity  Decreased range of motion of trunk and back  Decreased strength of trunk and back  Muscle weakness of lower extremity      Subjective Assessment - 12/20/15 1053    Subjective " The pain is doing better today, today I am only like a 2/10" pt reports pain only to the back of the knee"    Currently in Pain? Yes   Pain Score 2    Pain Location Knee   Pain Orientation Right   Pain Descriptors / Indicators Aching   Pain Onset More than a month ago   Pain Frequency Intermittent   Aggravating Factors  worse in the morinings   Pain Relieving Factors staying active    Pain Score 2   Pain Location Back   Pain Orientation Right;Left   Pain Descriptors / Indicators Aching                         [facility] Adult PT Treatment/Exercise - 12/20/15 1103    Lumbar Exercises: Stretches   Passive Hamstring Stretch 2  reps;30 seconds   Single Knee to Chest Stretch 2 reps;30 seconds   Lower Trunk Rotation 3 reps;30 seconds  holding at end range x 5 sec for stretch   Piriformis Stretch 2 reps;30 seconds   Lumbar Exercises: Aerobic   Stationary Bike Nu-Step L7 x 5 min   Lumbar Exercises: Standing   Other Standing Lumbar Exercises power tower shoulder press/ row 6# bil   VC to keep core tight and avoid rotating   Other Standing Lumbar Exercises seated on dyna disc arms flexion wtih marching 2 x 15 1 set slow, 1 set fast   Lumbar Exercises: Supine   Bridge 10 reps  with alternating kickouts   Other Supine Lumbar Exercises lying parallel to bolster doing marching with arms flexied 2 x 10    Lumbar Exercises: Prone   Opposite Arm/Leg Raise Right arm/Left leg;Left arm/Right leg   Knee/Hip Exercises: Standing   Hip Abduction AROM;Stengthening;Both;15 reps;Knee straight;2 sets  4#   Hip Extension AROM;Stengthening;Both;Knee straight;15 reps;2 sets  4#   Knee/Hip Exercises: Seated   Other Seated Knee/Hip Exercises seated on dynadisc with marching 2 x 15 with 4# weights and arms out forward.    Manual Therapy   Manual Therapy Neural Stretch  Neural Stretch neural sciatic  stretch and glides in supine and seated                 PT Education - 12/20/15 1154    Education provided Yes   Education Details educated on progression of exercise   Person(s) Educated Patient   Methods Explanation   Comprehension Verbalized understanding          PT Short Term Goals - 12/20/15 1156    PT SHORT TERM GOAL #1   Title Patient to be independent with HEP for core stabilization and flexibility.    Time 3   Period Weeks   Status On-going   PT SHORT TERM GOAL #2   Title Patient to demonstrate proper body mechanics for lifting techniques.    Time 3   Period Weeks   Status On-going           PT Long Term Goals - 12/20/15 1157    PT LONG TERM GOAL #1   Title Patient to demonstrate bilateral  hamstring flexibility to 10 degrees short of extension in active stretch position for bending tasks.    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    Time 6   Period Weeks   Status On-going   PT LONG TERM GOAL #3   Title Patient to demonstrate improved trunk flexion to within 6 inches of finger to floor for ADLs.   Time 6   Period Weeks   Status On-going               Plan - 12/20/15 1154    Clinical Impression Statement Cora Daniels continues to make progress with intermittent pain to the R posterior knee, and intermittent low back pain. following nueral glides and stretches he reported no pain in the leg or back. He was able to ocmplete all core exercises with some sorness in the low back following todays session which is suspected due muscle soreness. pt declined need for heat or ice.   PT Next Visit Plan Cont with flexibiity and stabilization exercieses. Include t-ball and possible progression to standing activites with core stability for incresed functional positions.  new measurements, recert if needed, goals, FOTO.    PT Home Exercise Plan now new HEP given.    Consulted and Agree with Plan of Care Patient        Problem List Patient Active Problem List   Diagnosis Date Noted  . HNP (herniated nucleus pulposus), lumbar 09/29/2015    Class: Acute  . Herniated nucleus pulposus, lumbar 09/29/2015   Starr Lake PT, DPT, LAT, ATC  12/20/2015  11:59 AM     [place] [facility] [address] [place], [place], [address] Phone: [phone]   Fax:  [phone]  Name: Nakul Avino MRN: [id number] Date of Birth: 1986/10/15    PHYSICAL THERAPY DISCHARGE SUMMARY  Visits from Start of Care: 6 of 9  Current functional level related to goals / functional outcomes: Goals as above. Had anticipated further sessions to  achieve the above goals. The patient was independent with a HEP but it was anticipated that this would continue to be progressed prior to D/C. Additionally the patient had not progressed to work related activities but education on body mechanics and functional activities had been addressed.   At this time the patient cancelled or no-showed for his  last 3 PT sessions and he has not scheduled any further appointments. At this time the patient will be D/C to his HEP.    Remaining deficits: Continued strength deficits and reports of pain with activity. Patient did respond well to PT and improved in all aspects of care.    Education / Equipment: As above, no equipment needs.  Plan:                                                    Patient goals were partially met. Patient is being discharged due to not returning since the last visit.  ?????       Cassell Clement, PT, [place] Pager [phone]

## 2015-12-22 ENCOUNTER — Ambulatory Visit: Payer: Self-pay | Admitting: Physical Therapy

## 2015-12-27 ENCOUNTER — Ambulatory Visit: Payer: Self-pay

## 2015-12-29 ENCOUNTER — Ambulatory Visit: Payer: Self-pay

## 2017-07-28 IMAGING — CR DG LUMBAR SPINE 2-3V
3 series · 3 of 3 positions shown · non-contrast
Comparison: Lumbar MRI 09/20/2015

CLINICAL DATA: L5-S1 laminectomy

EXAM:
LUMBAR SPINE - 2-3 VIEW

[lateral (1 of 3)]
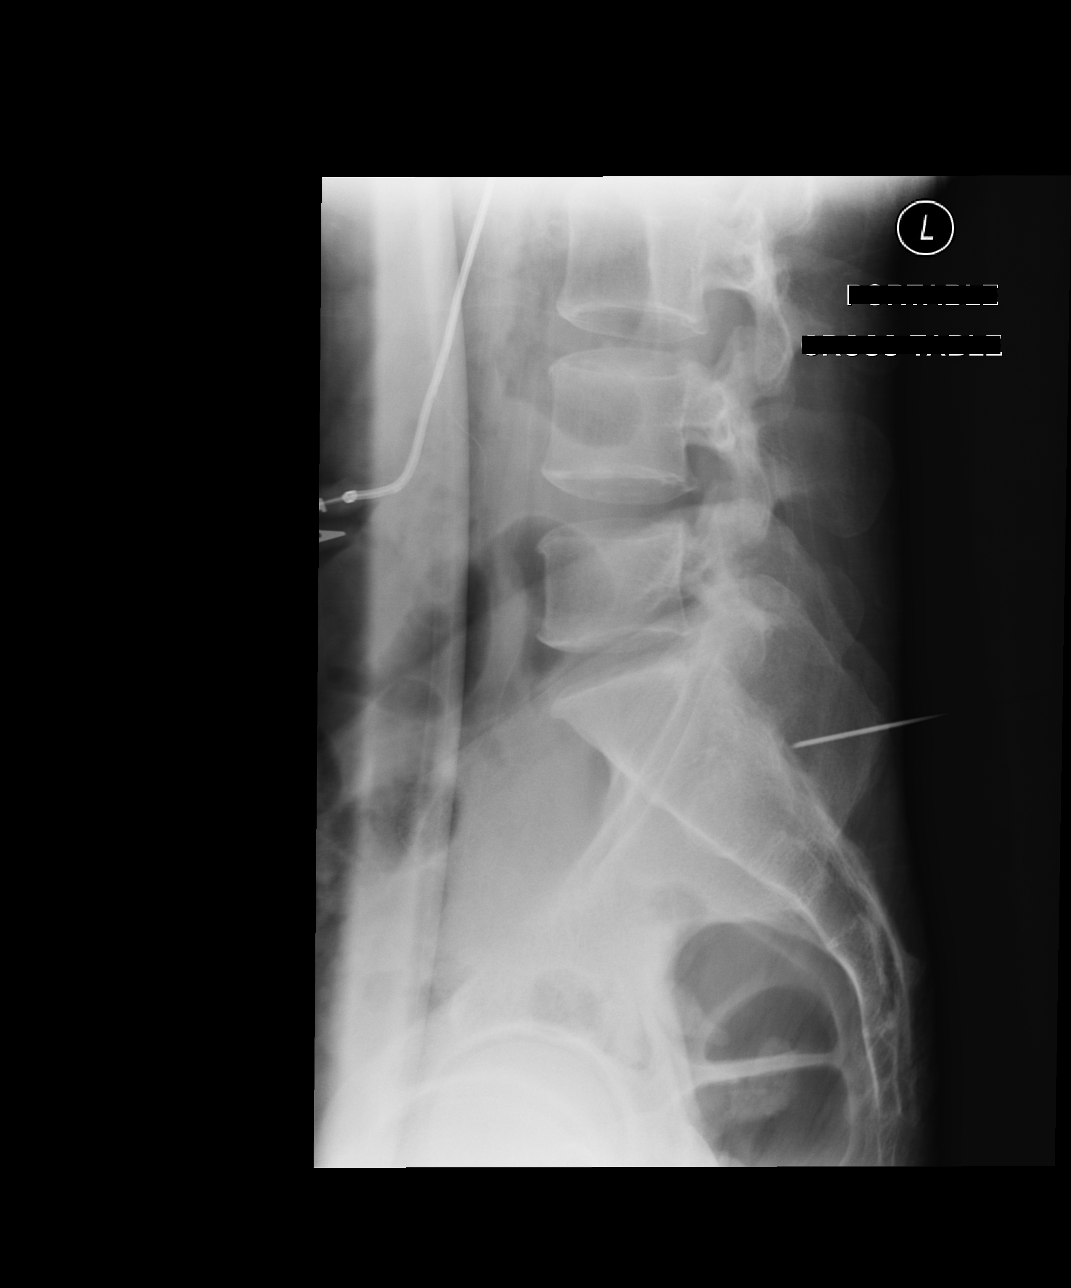

[lateral (2 of 3)]
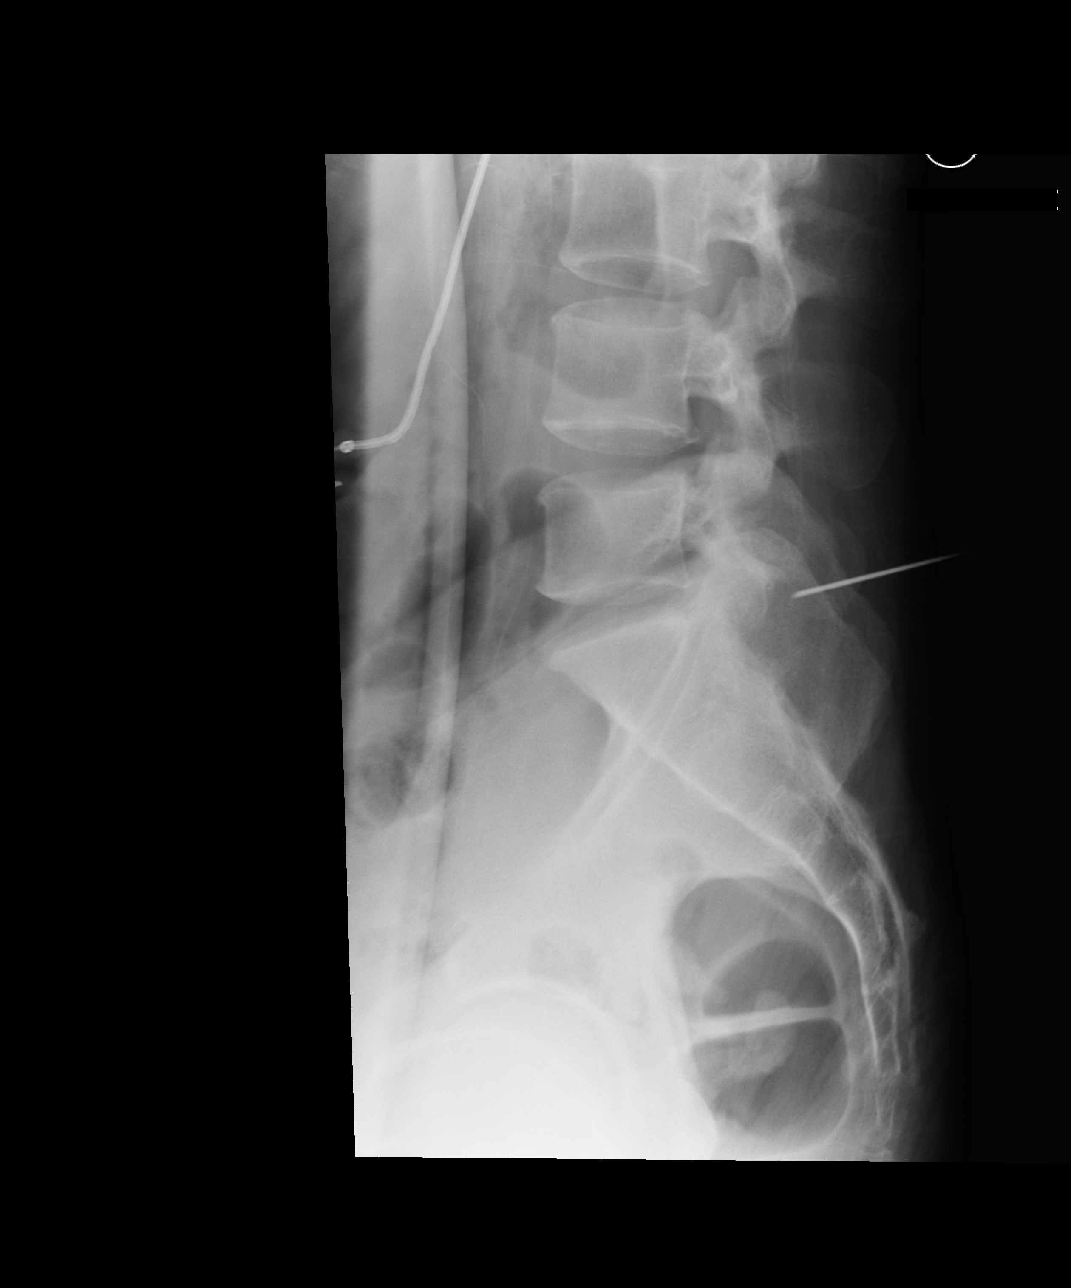

[lateral (3 of 3)]
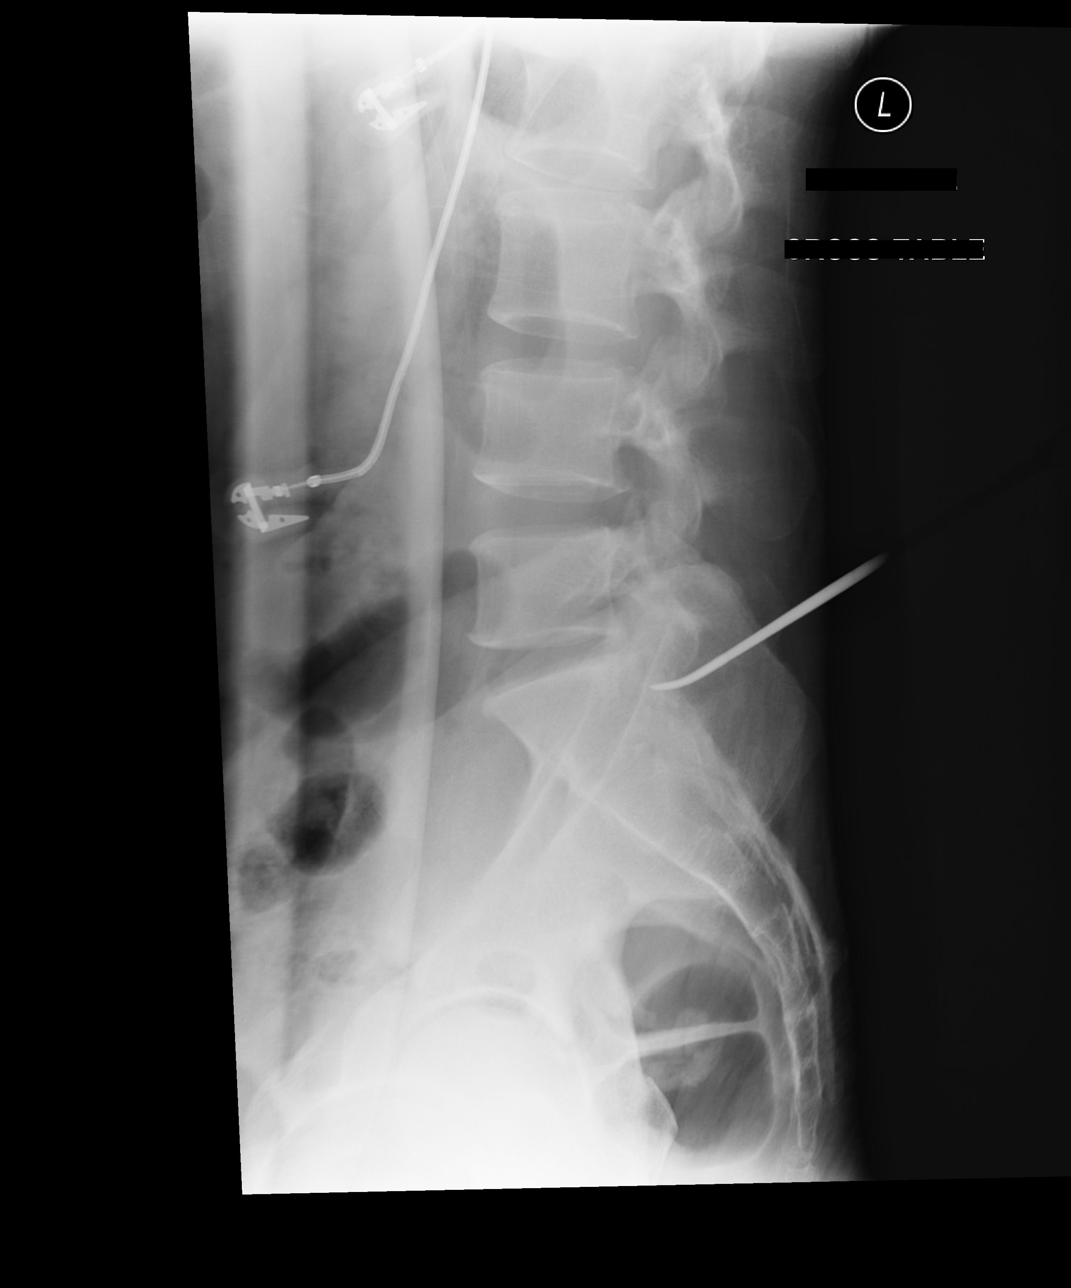

[3 of 3 positions shown; findings below may reference images not displayed]

FINDINGS: Three lateral images of the lumbar spine were obtained. The lowest
disc space is L5-S1 consistent with MRI numbering.

Image number 1 reveals a needle posterior to the S2 vertebral body
over the canal

Image number 2 reveals a needle overlying the L5 spinous process
directed at the L5-S1 disc space

Image number 3 reveals a surgical instrument advanced under the
lamina of L5 with the tip over the spinal canal below the L5-S1 disc
space.
IMPRESSION: L5-S1 localized.
